# Patient Record
Sex: Male | Born: 1950 | Race: White | Hispanic: No | Marital: Married | Smoking: Never smoker
Health system: Southern US, Community
[De-identification: ages and names within clinical notes are randomized; demographics above are authoritative.]

## PROBLEM LIST (undated history)

## (undated) DIAGNOSIS — N183 Chronic kidney disease, stage 3 unspecified: Secondary | ICD-10-CM

## (undated) DIAGNOSIS — N400 Enlarged prostate without lower urinary tract symptoms: Secondary | ICD-10-CM

## (undated) DIAGNOSIS — R7302 Impaired glucose tolerance (oral): Secondary | ICD-10-CM

## (undated) DIAGNOSIS — G4700 Insomnia, unspecified: Secondary | ICD-10-CM

## (undated) DIAGNOSIS — E7800 Pure hypercholesterolemia, unspecified: Secondary | ICD-10-CM

## (undated) DIAGNOSIS — R42 Dizziness and giddiness: Secondary | ICD-10-CM

## (undated) DIAGNOSIS — K219 Gastro-esophageal reflux disease without esophagitis: Secondary | ICD-10-CM

## (undated) DIAGNOSIS — D509 Iron deficiency anemia, unspecified: Secondary | ICD-10-CM

## (undated) DIAGNOSIS — K2970 Gastritis, unspecified, without bleeding: Secondary | ICD-10-CM

## (undated) DIAGNOSIS — K5790 Diverticulosis of intestine, part unspecified, without perforation or abscess without bleeding: Secondary | ICD-10-CM

## (undated) DIAGNOSIS — M1990 Unspecified osteoarthritis, unspecified site: Secondary | ICD-10-CM

## (undated) DIAGNOSIS — M109 Gout, unspecified: Secondary | ICD-10-CM

## (undated) DIAGNOSIS — I10 Essential (primary) hypertension: Secondary | ICD-10-CM

## (undated) DIAGNOSIS — K649 Unspecified hemorrhoids: Secondary | ICD-10-CM

## (undated) HISTORY — DX: Benign prostatic hyperplasia without lower urinary tract symptoms: N40.0

## (undated) HISTORY — DX: Insomnia, unspecified: G47.00

## (undated) HISTORY — DX: Impaired glucose tolerance (oral): R73.02

## (undated) HISTORY — DX: Pure hypercholesterolemia, unspecified: E78.00

## (undated) HISTORY — DX: Gastro-esophageal reflux disease without esophagitis: K21.9

## (undated) HISTORY — DX: Gout, unspecified: M10.9

## (undated) HISTORY — PX: KNEE ARTHROSCOPY: SUR90

## (undated) HISTORY — DX: Iron deficiency anemia, unspecified: D50.9

## (undated) HISTORY — DX: Gastritis, unspecified, without bleeding: K29.70

## (undated) HISTORY — DX: Unspecified osteoarthritis, unspecified site: M19.90

## (undated) HISTORY — DX: Diverticulosis of intestine, part unspecified, without perforation or abscess without bleeding: K57.90

## (undated) HISTORY — PX: UPPER GASTROINTESTINAL ENDOSCOPY: SHX188

## (undated) HISTORY — DX: Essential (primary) hypertension: I10

## (undated) HISTORY — PX: CATARACT EXTRACTION: SUR2

## (undated) HISTORY — PX: TONSILLECTOMY: SUR1361

## (undated) HISTORY — DX: Chronic kidney disease, stage 3 unspecified: N18.30

## (undated) HISTORY — DX: Dizziness and giddiness: R42

## (undated) HISTORY — DX: Unspecified hemorrhoids: K64.9

---

## 2002-07-09 HISTORY — PX: INGUINAL HERNIA REPAIR: SUR1180

## 2003-03-12 ENCOUNTER — Encounter (HOSPITAL_BASED_OUTPATIENT_CLINIC_OR_DEPARTMENT_OTHER): Payer: Self-pay | Admitting: General Surgery

## 2003-03-16 ENCOUNTER — Ambulatory Visit (HOSPITAL_COMMUNITY): Admission: RE | Admit: 2003-03-16 | Discharge: 2003-03-16 | Payer: Self-pay | Admitting: General Surgery

## 2004-11-10 ENCOUNTER — Ambulatory Visit: Payer: Self-pay | Admitting: Internal Medicine

## 2004-11-24 ENCOUNTER — Ambulatory Visit: Payer: Self-pay | Admitting: Internal Medicine

## 2009-05-16 ENCOUNTER — Ambulatory Visit: Payer: Self-pay | Admitting: Cardiology

## 2009-05-16 ENCOUNTER — Encounter (INDEPENDENT_AMBULATORY_CARE_PROVIDER_SITE_OTHER): Payer: Self-pay | Admitting: Family Medicine

## 2009-05-16 ENCOUNTER — Encounter: Payer: Self-pay | Admitting: Cardiovascular Disease

## 2009-05-16 ENCOUNTER — Ambulatory Visit: Payer: Self-pay

## 2009-05-16 ENCOUNTER — Ambulatory Visit (HOSPITAL_COMMUNITY): Admission: RE | Admit: 2009-05-16 | Discharge: 2009-05-16 | Payer: Self-pay | Admitting: Family Medicine

## 2009-05-16 DIAGNOSIS — R0989 Other specified symptoms and signs involving the circulatory and respiratory systems: Secondary | ICD-10-CM

## 2009-05-23 ENCOUNTER — Telehealth (INDEPENDENT_AMBULATORY_CARE_PROVIDER_SITE_OTHER): Payer: Self-pay | Admitting: *Deleted

## 2010-11-24 NOTE — Op Note (Signed)
NAME:  GILL, DELROSSI NO.:  [id number]   MEDICAL RECORD NO.:  [id number]                   PATIENT TYPE:  OIB   LOCATION:                                       FACILITY:  [facility]   PHYSICIAN:  Leonie Man, M.D.                DATE OF BIRTH:  Sep 03, 1950   DATE OF PROCEDURE:  03/16/2003  DATE OF DISCHARGE:                                 OPERATIVE REPORT   PREOPERATIVE DIAGNOSIS:  Bilateral inguinal hernias.   POSTOPERATIVE DIAGNOSIS:  Bilateral direct inguinal hernias.   OPERATION PERFORMED:  Repair of bilateral direct inguinal hernias with mesh.   SURGEON:  Leonie Man, M.D.   ASSISTANT:  Nurse.   ANESTHESIA:  General.   INDICATIONS FOR PROCEDURE:  The patient is a 60 year old man presenting with  right groin pain, bulge and discomfort and on examination, noted to have  inguinal hernias on both sides.  He comes to the operating room after the  risks and potential benefits of surgery had been fully discussed and all  questions answered and consent obtained.   DESCRIPTION OF PROCEDURE:  Following induction of satisfactory general  anesthesia, the patient was positioned supinely and the groins were prepped  and draped to be included in the sterile operative field.  I began on the  right side and infiltrated the right lower groin crease with 0.5% Marcaine  with epinephrine.  I made a transverse incision approximately 6 cm in  length, deepening this through the skin and subcutaneous tissue, dissecting  down to the external oblique aponeurosis.  The external oblique aponeurosis  was opened up through the external inguinal ring with protection of the  ilioinguinal nerve which was retracted laterally.  The spermatic cord was  elevated and held with a Penrose drain.  Dissection along the anterior  medial aspect of the spermatic cord revealed a very large lipoma which was  dissected free from the surrounding cord contents and vessels up to the  internal ring and this was clamped and then ligated with a 2-0 silk suture.  Subsequently, a very large direct inguinal hernia was dissected free from  the medial and inferior portion of the cord.  This was reduced back into the  retroperitoneum.  We repaired the defect with an onlay patch of  polypropylene mesh which was sewn in at the pubic tubercle with a 2-0  Novofil suture and carrying this as a running suture up along the conjoined  tendon to the internal ring and again from the pubic tubercle along the  shelving edge of Poupart's ligament up to the internal ring.  The mesh was  split allowing the cord to protrude between the leaflets of the mesh.  The  mesh was then trimmed and sutured into the internal oblique muscles thereby  closing off the internal ring.  There was no undue stress or constriction of  the cord noted.  Sponge, instrument and  sharp counts were then verified.  Additional injections of 0.5% Marcaine with epinephrine were used so as to  prolong analgesia.  The external oblique aponeurosis was then closed with a  running 2-0 Vicryl suture protecting the ilioinguinal nerve.  Scarpa's  fascia was closed with running 3-0 Vicryl suture.  Attention was then turned  to the left side where a symmetrically placed incision of approximately 6 cm  was carried down through the skin and subcutaneous tissue and dissection  carried down through the external oblique aponeurosis.  The external oblique  aponeurosis was opened up through the external inguinal ring which retracted  off the ilioinguinal nerve which was retracted laterally and inferiorly.  The spermatic cord was elevated from the inguinal floor and held with a  Penrose drain. Again, a very large lipoma was noted within the anteromedial  aspect of the cord.  This was dissected free from the cord, __________  skeletonizing the cord and carrying the dissection up to the internal ring.  A large lipoma was then clamped and ligated  with 2-0 silk sutures and  amputated and discarded.  Another large direct hernia was noted on the left  side and this was dissected away from the spermatic cord and reduced into  the retroperitoneum and then repaired with an onlay patch of polypropylene  mesh which was sewn in with 2-0 Novofil suture, carrying the suture line up  along the conjoined tendon to the internal ring and again from the pubic  tubercle along the shelving edge of Poupart's ligament up to the internal  ring.  The mesh was split allowing the cord to protrude between the leaflets  of the mesh and the mesh was then trimmed and sutured into the internal  oblique muscle behind the cord assuring that the cord was not unduly  constricted by the suture.  Sponge, instrument and sharp counts were then  verified.  This wound was closed in layers as follows after additional  injections of 0.5% Marcaine were used to prolong analgesia.  The external  oblique aponeurosis was closed with a running suture of 2-0 Vicryl.  Scarpa's fascia closed with a running suture of 3-0 Vicryl.  Both skin  wounds were then closed with running 4-0 Monocryl sutures and then placed  subcuticularly and then reinforced with Steri-Strips.  Sterile dressings  were then applied.  Anesthetic was reversed and the patient removed from the  operating room to the recovery room in stable condition, having tolerated  the procedure well.                                               Leonie Man, M.D.    PB/MEDQ  D:  03/16/2003  T:  03/16/2003  Job:  [id number]

## 2011-08-03 ENCOUNTER — Ambulatory Visit (INDEPENDENT_AMBULATORY_CARE_PROVIDER_SITE_OTHER): Payer: BC Managed Care – PPO | Admitting: *Deleted

## 2011-08-03 DIAGNOSIS — R0989 Other specified symptoms and signs involving the circulatory and respiratory systems: Secondary | ICD-10-CM

## 2011-08-06 ENCOUNTER — Encounter: Payer: Self-pay | Admitting: Family Medicine

## 2011-08-09 NOTE — Procedures (Unsigned)
CAROTID DUPLEX EXAM  INDICATION:  Follow carotid artery disease.  HISTORY: Diabetes:  No. Cardiac:  No. Hypertension:  Yes. Smoking:  No. Previous Surgery: CV History:  Asystomatic. Amaurosis Fugax:  No, Paresthesias No, Hemiparesis No                                      RIGHT             LEFT Brachial systolic pressure:         112               116 Brachial Doppler waveforms:         Biphasic.         Biphasic. Vertebral direction of flow:        Antegrade.        Antegrade. DUPLEX VELOCITIES (cm/sec) CCA peak systolic                   123               122 ECA peak systolic                   106               90 ICA peak systolic                   104               96 ICA end diastolic                   29                36 PLAQUE MORPHOLOGY:                  Mixed, irregular. Mixed, irregular. PLAQUE AMOUNT:                      Small to moderate.                  Moderate. PLAQUE LOCATION:                    Bifurcation and ICA.                Bifurcation and ICA.   IMPRESSION:  A 20% to 39% bilateral ICA stenosis, left greater than right.        ___________________________________________ Larina Earthly, M.D.  SS/MEDQ  D:  08/03/2011  T:  08/03/2011  Job:  [id number]

## 2012-09-24 ENCOUNTER — Other Ambulatory Visit (INDEPENDENT_AMBULATORY_CARE_PROVIDER_SITE_OTHER): Payer: BC Managed Care – PPO | Admitting: *Deleted

## 2012-09-24 DIAGNOSIS — I6529 Occlusion and stenosis of unspecified carotid artery: Secondary | ICD-10-CM

## 2013-08-28 ENCOUNTER — Other Ambulatory Visit: Payer: Self-pay | Admitting: Family Medicine

## 2013-08-28 ENCOUNTER — Ambulatory Visit
Admission: RE | Admit: 2013-08-28 | Discharge: 2013-08-28 | Disposition: A | Discharge status: Home / Self Care | Payer: BC Managed Care – PPO | Source: Ambulatory Visit | Attending: Family Medicine | Admitting: Family Medicine

## 2013-08-28 DIAGNOSIS — R05 Cough: Secondary | ICD-10-CM

## 2013-08-28 DIAGNOSIS — R059 Cough, unspecified: Secondary | ICD-10-CM

## 2015-01-13 ENCOUNTER — Encounter: Payer: Self-pay | Admitting: Internal Medicine

## 2017-01-01 DIAGNOSIS — D126 Benign neoplasm of colon, unspecified: Secondary | ICD-10-CM

## 2017-01-01 HISTORY — DX: Benign neoplasm of colon, unspecified: D12.6

## 2017-01-01 HISTORY — PX: COLONOSCOPY W/ POLYPECTOMY: SHX1380

## 2018-06-12 ENCOUNTER — Ambulatory Visit
Admission: RE | Admit: 2018-06-12 | Discharge: 2018-06-12 | Disposition: A | Discharge status: Home / Self Care | Payer: Medicare Other | Source: Ambulatory Visit | Attending: Family Medicine | Admitting: Family Medicine

## 2018-06-12 ENCOUNTER — Other Ambulatory Visit: Payer: Self-pay | Admitting: Family Medicine

## 2018-06-12 DIAGNOSIS — R51 Headache: Principal | ICD-10-CM

## 2018-06-12 DIAGNOSIS — R519 Headache, unspecified: Secondary | ICD-10-CM

## 2018-06-25 ENCOUNTER — Other Ambulatory Visit: Payer: Self-pay | Admitting: Family Medicine

## 2018-06-25 DIAGNOSIS — G4489 Other headache syndrome: Secondary | ICD-10-CM

## 2018-06-27 ENCOUNTER — Ambulatory Visit
Admission: RE | Admit: 2018-06-27 | Discharge: 2018-06-27 | Disposition: A | Discharge status: Home / Self Care | Payer: Medicare Other | Source: Ambulatory Visit | Attending: Family Medicine | Admitting: Family Medicine

## 2018-06-27 DIAGNOSIS — G4489 Other headache syndrome: Secondary | ICD-10-CM

## 2018-06-27 MED ORDER — GADOBENATE DIMEGLUMINE 529 MG/ML IV SOLN
20.0000 mL | Freq: Once | INTRAVENOUS | Status: AC | PRN
  Administered 2018-06-27: 20 mL via INTRAVENOUS

## 2018-07-06 ENCOUNTER — Other Ambulatory Visit: Payer: Medicare Other

## 2018-07-09 HISTORY — PX: UMBILICAL HERNIA REPAIR: SHX2598

## 2019-08-26 ENCOUNTER — Ambulatory Visit: Payer: Medicare Other

## 2020-01-05 ENCOUNTER — Telehealth: Payer: Self-pay

## 2020-01-05 NOTE — Telephone Encounter (Signed)
I left a detailed message for him to call me and we will work him in per Dr Silvano Rusk.

## 2020-01-05 NOTE — Telephone Encounter (Signed)
Dr Sandi Mariscal is requesting this patient be seen ASAP for his iron def anemia[place] he can be seen. He had a colonoscopy with you in 2006 Sir. Please advise , I have put the records on your desk. Thank you for your time.

## 2020-01-06 NOTE — Telephone Encounter (Signed)
We have set him up to come 01/14/2020 at 11:30AM. His PCP has informed him of this information.

## 2020-01-14 ENCOUNTER — Ambulatory Visit (INDEPENDENT_AMBULATORY_CARE_PROVIDER_SITE_OTHER): Payer: Medicare Other | Admitting: Internal Medicine

## 2020-01-14 ENCOUNTER — Encounter: Payer: Self-pay | Admitting: Internal Medicine

## 2020-01-14 VITALS — BP 130/80 | HR 52 | Ht 68.0 in | Wt 196.5 lb

## 2020-01-14 DIAGNOSIS — Z86010 Personal history of colonic polyps: Secondary | ICD-10-CM

## 2020-01-14 DIAGNOSIS — D508 Other iron deficiency anemias: Secondary | ICD-10-CM

## 2020-01-14 DIAGNOSIS — D649 Anemia, unspecified: Secondary | ICD-10-CM | POA: Insufficient documentation

## 2020-01-14 DIAGNOSIS — R1319 Other dysphagia: Secondary | ICD-10-CM

## 2020-01-14 DIAGNOSIS — R1310 Dysphagia, unspecified: Secondary | ICD-10-CM

## 2020-01-14 NOTE — Patient Instructions (Signed)
You have been scheduled for an endoscopy and colonoscopy. Please follow the written instructions given to you at your visit today. Please pick up your prep supplies at the pharmacy within the next 1-3 days. If you use inhalers (even only as needed), please bring them with you on the day of your procedure.   Please purchase over the counter ferrous Sulfate 325mg  and take one daily.  Then hold it 3 days prior to your procedure.   I appreciate the opportunity to care for you. Silvano Rusk, MD, [facility]

## 2020-01-14 NOTE — Progress Notes (Signed)
Nathaniel [place] 69 y.o. 1950/11/23 [id number]  Assessment & Plan:   Encounter Diagnoses  Name Primary?  . Other iron deficiency anemia Yes  . Esophageal dysphagia   . Hx of adenomatous colonic polyps     Evaluate with EGD and possible esophageal dilation and I think it is prudent to repeat a colonoscopy as well.  Consider celiac biopsies though unlikely depending upon what is seen at the EGD will consider those at the time of procedure.  The risks and benefits as well as alternatives of endoscopic procedure(s) have been discussed and reviewed. All questions answered. The patient agrees to proceed.   Start ferrous sulfate 325 mg daily.  I told him though Hemoccults are reasonable I do not think he needs to complete that testing that was offered by Nathaniel [place].  It will not change her work-up at this point.  I appreciate the opportunity to care for this patient. CC: Nathaniel Late, MD    Subjective:   Chief Complaint: Iron deficiency anemia  HPI Nathaniel [place] is a 69 year old married white man referred by Dr. Derinda [place] because of the new iron deficiency anemia.  Recent laboratory testing in [place] June of this year demonstrated a hemoglobin of 12.7 with an MCV of 83 normal white count and platelet counts.  Thyroid testing normal electrolytes chemistries all normal.  Does have very mild chronic kidney disease with a creatinine of 1.3 with a GFR of 56.  Ferritin was 7 iron saturation 7% TIBC 487 and total iron 38.  He denies any bleeding, there is iron in his diet and he is not a regular blood donor.  [place], [place] exam was negative for any polyps.  A repeat colonoscopy 3 years ago in [place] revealed a 3 mm a sending adenoma and a rectal hyperplastic polyp that was diminutive.  He does have intermittent dysphagia about once every 8 weeks to solid foods.  He is on a PPI which generally controls heartburn but occasionally has to  reach for some Tums. No Known Allergies Current Meds  Medication Sig  . allopurinol (ZYLOPRIM) 300 MG tablet Take 150 mg by mouth daily.  Marland Kitchen ALPRAZolam (XANAX) 0.5 MG tablet Take 0.5 mg by mouth daily as needed.  . chlorthalidone (HYGROTON) 25 MG tablet Take 25 mg by mouth every morning.  Marland Kitchen lisinopril (ZESTRIL) 10 MG tablet Take 10 mg by mouth daily.  Marland Kitchen omeprazole (PRILOSEC) 20 MG capsule Take 20 mg by mouth daily.  . potassium chloride (KLOR-CON) 10 MEQ tablet Take 10 mEq by mouth 4 (four) times daily.  . rosuvastatin (CRESTOR) 20 MG tablet Take 20 mg by mouth at bedtime.  . tadalafil (CIALIS) 5 MG tablet Take 5 mg by mouth daily.   Past Medical History:  Diagnosis Date  . BPH (benign prostatic hyperplasia)   . Diverticulosis   . Gastritis   . GERD (gastroesophageal reflux disease)   . Gout   . Hemorrhoids   . Hypercholesterolemia   . Hypertension   . Impaired glucose tolerance   . Insomnia    intermittent  . Iron deficiency anemia   . Osteoarthritis    knees  . Stage 3 chronic kidney disease   . Tubular adenoma of colon 01/01/2017  . Vertigo    since childhood   Past Surgical History:  Procedure Laterality Date  . COLONOSCOPY W/ POLYPECTOMY  01/01/2017  . INGUINAL HERNIA REPAIR Bilateral 2004  . KNEE ARTHROSCOPY  Left   . TONSILLECTOMY    . UMBILICAL HERNIA REPAIR  2020   Social History   Social History Narrative   Married to [place] in [place], is in [place] frequently for work, he is employed in Nutritional therapist.   4 daughters 10 grandchildren   Occasional alcohol never smoker 2 caffeinated beverages a day no other tobacco   family history includes Breast cancer in his mother and sister; Diabetes in his brother; Heart attack in his maternal grandfather and paternal grandfather; Hiatal hernia in his maternal grandfather; Hypertension in his brother; Kidney Stones in his brother, brother, and father; Sleep apnea in his brother and brother.   Review  of Systems  As per HPI, otherwise negative. Objective:   Physical Exam @BP  130/80 (BP Location: Left Arm, Patient Position: Sitting, Cuff Size: Normal)   Pulse (!) 52   Ht 5\' 8"  (1.727 m) Comment: height measured without shoes  Wt 196 lb 8 oz (89.1 kg)   BMI 29.88 kg/m @  General:  Well-developed, well-nourished and in no acute distress Eyes:  anicteric.  Lungs: Clear to auscultation bilaterally. Heart:  S1S2, no rubs, murmurs, gallops. Abdomen:  soft, non-tender, no hepatosplenomegaly, hernia, or mass and BS+.  Rectal: Deferred until colonoscopy Extremities:   no edema, cyanosis or clubbing Neuro:  A&O x 3.  Psych:  appropriate mood and  Affect.   Data Reviewed:  See HPI

## 2020-02-11 ENCOUNTER — Encounter: Payer: Self-pay | Admitting: Internal Medicine

## 2020-02-24 ENCOUNTER — Encounter: Payer: Self-pay | Admitting: Certified Registered Nurse Anesthetist

## 2020-02-25 ENCOUNTER — Telehealth: Payer: Self-pay | Admitting: Gastroenterology

## 2020-02-25 ENCOUNTER — Ambulatory Visit (AMBULATORY_SURGERY_CENTER): Payer: Medicare Other | Admitting: Gastroenterology

## 2020-02-25 ENCOUNTER — Other Ambulatory Visit: Payer: Self-pay | Admitting: *Deleted

## 2020-02-25 ENCOUNTER — Encounter: Payer: Self-pay | Admitting: Gastroenterology

## 2020-02-25 ENCOUNTER — Encounter: Payer: Medicare Other | Admitting: Internal Medicine

## 2020-02-25 ENCOUNTER — Other Ambulatory Visit: Payer: Self-pay

## 2020-02-25 VITALS — BP 123/75 | HR 67 | Temp 97.9°F | Resp 13 | Ht 68.0 in | Wt 196.0 lb

## 2020-02-25 DIAGNOSIS — R1310 Dysphagia, unspecified: Secondary | ICD-10-CM | POA: Diagnosis not present

## 2020-02-25 DIAGNOSIS — K222 Esophageal obstruction: Secondary | ICD-10-CM

## 2020-02-25 DIAGNOSIS — K649 Unspecified hemorrhoids: Secondary | ICD-10-CM | POA: Diagnosis not present

## 2020-02-25 DIAGNOSIS — D122 Benign neoplasm of ascending colon: Secondary | ICD-10-CM | POA: Diagnosis not present

## 2020-02-25 DIAGNOSIS — K449 Diaphragmatic hernia without obstruction or gangrene: Secondary | ICD-10-CM

## 2020-02-25 DIAGNOSIS — K3189 Other diseases of stomach and duodenum: Secondary | ICD-10-CM

## 2020-02-25 DIAGNOSIS — D509 Iron deficiency anemia, unspecified: Secondary | ICD-10-CM

## 2020-02-25 DIAGNOSIS — D120 Benign neoplasm of cecum: Secondary | ICD-10-CM

## 2020-02-25 DIAGNOSIS — K5730 Diverticulosis of large intestine without perforation or abscess without bleeding: Secondary | ICD-10-CM | POA: Diagnosis not present

## 2020-02-25 DIAGNOSIS — K317 Polyp of stomach and duodenum: Secondary | ICD-10-CM

## 2020-02-25 MED ORDER — FERROUS SULFATE 325 (65 FE) MG PO TBEC: 325.0000 mg | DELAYED_RELEASE_TABLET | Freq: Every day | ORAL | 3 refills | Status: DC

## 2020-02-25 MED ORDER — SODIUM CHLORIDE 0.9 % IV SOLN: 500.0000 mL | Freq: Once | INTRAVENOUS | Status: DC

## 2020-02-25 MED ORDER — ONDANSETRON HCL 4 MG PO TABS: 4.0000 mg | ORAL_TABLET | Freq: Three times a day (TID) | ORAL | 0 refills | Status: AC | PRN

## 2020-02-25 NOTE — Op Note (Signed)
[facility] Patient Name: Nathaniel [place] Procedure Date: 02/25/2020 7:30 AM MRN: [id number] Endoscopist: Remo Lipps P. Havery Moros , MD Age: 69 Referring MD:  Date of Birth: 11-Feb-1951 Gender: Male Account #: [id number] Procedure:                Upper GI endoscopy Indications:              Iron deficiency anemia, history of GERD on                            omeprazole 20mg  / day Medicines:                Monitored Anesthesia Care Procedure:                Pre-Anesthesia Assessment:                           - Prior to the procedure, a History and Physical                            was performed, and patient medications and                            allergies were reviewed. The patient's tolerance of                            previous anesthesia was also reviewed. The risks                            and benefits of the procedure and the sedation                            options and risks were discussed with the patient.                            All questions were answered, and informed consent                            was obtained. Prior Anticoagulants: The patient has                            taken no previous anticoagulant or antiplatelet                            agents. ASA Grade Assessment: III - A patient with                            severe systemic disease. After reviewing the risks                            and benefits, the patient was deemed in                            satisfactory condition to undergo the procedure.  After obtaining informed consent, the endoscope was                            passed under direct vision. Throughout the                            procedure, the patient's blood pressure, pulse, and                            oxygen saturations were monitored continuously. The                            Endoscope was introduced through the mouth, and                            advanced to the second part of  duodenum. The upper                            GI endoscopy was accomplished without difficulty.                            The patient tolerated the procedure well. Scope In: Scope Out: Findings:                 Esophagogastric landmarks were identified: the                            Z-line was found at 34 cm, the gastroesophageal                            junction was found at 34 cm and the upper extent of                            the gastric folds was found at 43 cm from the                            incisors.                           A large 9 cm hiatal hernia was present. No obvious                            Cameron lesions noted however erythema noted within                            hernia sac.                           One benign-appearing, intrinsic mild stenosis was                            found 34 cm from the incisors. This stenosis  measured less than one cm (in length). A TTS                            dilator was passed through the scope. Dilation with                            an 18-19-20 mm balloon dilator was performed to 18                            mm, 19 mm and 20 mm with good result                           The examined distal esophagus was extremely                            tortuous due to large hernia, possible this could                            be related to dysphagia in the setting of large                            hiatal hernia.                           The exam of the esophagus was otherwise normal.                           Multiple 3 to 15 mm pedunculated and sessile polyps                            were found in the gastric fundus and in the gastric                            body. Two polyps about 4-19mm in size were                            erythematous and friable and removed with a hot                            snare. One of the largest polyps was removed with                            hot snare and  hemostasis clip placed at the base to                            prevent bleeding. One 12mm polyp within the hernia                            sac appeared to be a benign lymphangiectasia but                            removed to ensure no adenomatous  change.                           The exam of the stomach was otherwise normal.                           Biopsies were taken with a cold forceps in the                            gastric body, at the incisura and in the gastric                            antrum for Helicobacter pylori testing given iron                            deficiency anemia.                           There was a small lipoma in the second portion of                            the duodenum.                           Benign ectopic gastric mucosa was noted in the                            duodenal bulb. The exam of the duodenum was                            otherwise normal. Complications:            No immediate complications. Estimated blood loss:                            Minimal. Estimated Blood Loss:     Estimated blood loss was minimal. Impression:               - Esophagogastric landmarks identified.                           - Large 9 cm hiatal hernia.                           - Benign-appearing esophageal stenosis. Dilated to                            107mm.                           - Tortuous esophagus.                           - Normal esophagus otherwise                           - Multiple gastric polyps. 4 resected and retrieved  as above. One prophylactic clip was placed.                           - Normal stomach otherwise - biopsies taken to rule                            out H pylori                           - Duodenal lipoma.                           - Normal duodenum otherwise                           A few gastric polyps inflamed / erythematous in                            appearance, removed in case related to  anemia.                            Largest gastric polyp removed, several large polyps                            remain, these are all benign appearing and suspect                            likely fundic gland polyps in the setting of PPI                            use. No obvious cameron lesions noted however some                            erythema within the hernia sac, large hiatal hernia                            could be related to iron deficiency in this light. Recommendation:           - Patient has a contact number available for                            emergencies. The signs and symptoms of potential                            delayed complications were discussed with the                            patient. Return to normal activities tomorrow.                            Written discharge instructions were provided to the                            patient.                           -  Resume previous diet.                           - Continue present medications (omeprazole)                           - Start ferrous sulfate 325mg  daily                           - Await pathology results.                           - No ibuprofen, naproxen, or other non-steroidal                            anti-inflammatory drugs for 2 weeks after polyp                            removal.                           - Repeat CBC in 4-6 weeks Remo Lipps P. Meliza Kage, MD 02/25/2020 9:05:41 AM This report has been signed electronically.

## 2020-02-25 NOTE — Progress Notes (Signed)
0745 Patient experiencing nausea and vomiting.  MD updated and Zofran 4 mg IV given. Robinul 0.1 mg IV given due large amount of secretions upon assessment.  MD made aware, vss

## 2020-02-25 NOTE — Telephone Encounter (Signed)
Pt's wife states the pt is still experiencing nausea and vomiting. Pt had a colonscopy and EGD today. Would like to know if medication could be prescribed

## 2020-02-25 NOTE — Patient Instructions (Signed)
Impression/Recommendations:  Hiatal hernia handout given to patient. Polyp handout given to patient. Diverticulosis handout given to patient. Hemorrhoid handout given to patient.   Resume previous diet. Continue present medications. (Omeprazole) Start ferrous sulfate 325 mg. Daily.  Await pathology results.  No ibuprofen, naproxen, or other NSAID drugs for 2 weeks.  Tylenol only .  Repeat CBC in 4-6- weeks.  YOU HAD AN ENDOSCOPIC PROCEDURE TODAY AT [facility]:   Refer to the procedure report that was given to you for any specific questions about what was found during the examination.  If the procedure report does not answer your questions, please call your gastroenterologist to clarify.  If you requested that your care partner not be given the details of your procedure findings, then the procedure report has been included in a sealed envelope for you to review at your convenience later.  YOU SHOULD EXPECT: Some feelings of bloating in the abdomen. Passage of more gas than usual.  Walking can help get rid of the air that was put into your GI tract during the procedure and reduce the bloating. If you had a lower endoscopy (such as a colonoscopy or flexible sigmoidoscopy) you may notice spotting of blood in your stool or on the toilet paper. If you underwent a bowel prep for your procedure, you may not have a normal bowel movement for a few days.  Please Note:  You might notice some irritation and congestion in your nose or some drainage.  This is from the oxygen used during your procedure.  There is no need for concern and it should clear up in a day or so.  SYMPTOMS TO REPORT IMMEDIATELY:   Following lower endoscopy (colonoscopy or flexible sigmoidoscopy):  Excessive amounts of blood in the stool  Significant tenderness or worsening of abdominal pains  Swelling of the abdomen that is new, acute  Fever of 100F or higher   Following upper endoscopy (EGD)  Vomiting  of blood or coffee ground material  New chest pain or pain under the shoulder blades  Painful or persistently difficult swallowing  New shortness of breath  Fever of 100F or higher  Black, tarry-looking stools  For urgent or emergent issues, a gastroenterologist can be reached at any hour by calling [phone]. Do not use MyChart messaging for urgent concerns.    DIET:  We do recommend a small meal at first, but then you may proceed to your regular diet.  Drink plenty of fluids but you should avoid alcoholic beverages for 24 hours.  ACTIVITY:  You should plan to take it easy for the rest of today and you should NOT DRIVE or use heavy machinery until tomorrow (because of the sedation medicines used during the test).    FOLLOW UP: Our staff will call the number listed on your records 48-72 hours following your procedure to check on you and address any questions or concerns that you may have regarding the information given to you following your procedure. If we do not reach you, we will leave a message.  We will attempt to reach you two times.  During this call, we will ask if you have developed any symptoms of COVID 19. If you develop any symptoms (ie: fever, flu-like symptoms, shortness of breath, cough etc.) before then, please call [phone].  If you test positive for Covid 19 in the 2 weeks post procedure, please call and report this information to [place].    If any biopsies were taken you will  be contacted by phone or by letter within the next 1-3 weeks.  Please call us at [phone] if you have not heard about the biopsies in 3 weeks.    SIGNATURES/CONFIDENTIALITY: You and/or your care partner have signed paperwork which will be entered into your electronic medical record.  These signatures attest to the fact that that the information above on your After Visit Summary has been reviewed and is understood.  Full responsibility of the confidentiality of this discharge information lies  with you and/or your care-partner.

## 2020-02-25 NOTE — Op Note (Signed)
[place] Patient Name: Nathaniel Grimes Procedure Date: 02/25/2020 7:30 AM MRN: [id number] Endoscopist: Remo Lipps P. Havery Moros , MD Age: 69 Referring MD:  Date of Birth: 1951/01/21 Gender: Male Account #: [id number] Procedure:                Colonoscopy Indications:              Iron deficiency anemia Medicines:                Monitored Anesthesia Care Procedure:                Pre-Anesthesia Assessment:                           - Prior to the procedure, a History and Physical                            was performed, and patient medications and                            allergies were reviewed. The patient's tolerance of                            previous anesthesia was also reviewed. The risks                            and benefits of the procedure and the sedation                            options and risks were discussed with the patient.                            All questions were answered, and informed consent                            was obtained. Prior Anticoagulants: The patient has                            taken no previous anticoagulant or antiplatelet                            agents. ASA Grade Assessment: III - A patient with                            severe systemic disease. After reviewing the risks                            and benefits, the patient was deemed in                            satisfactory condition to undergo the procedure.                           After obtaining informed consent, the colonoscope  was passed under direct vision. Throughout the                            procedure, the patient's blood pressure, pulse, and                            oxygen saturations were monitored continuously. The                            Colonoscope was introduced through the anus and                            advanced to the the terminal ileum, with                            identification of the appendiceal  orifice and IC                            valve. The colonoscopy was performed without                            difficulty. The patient tolerated the procedure                            well. The quality of the bowel preparation was                            adequate. The terminal ileum, ileocecal valve,                            appendiceal orifice, and rectum were photographed. Scope In: 8:29:24 AM Scope Out: 8:49:20 AM Scope Withdrawal Time: 0 hours 16 minutes 29 seconds  Total Procedure Duration: 0 hours 19 minutes 56 seconds  Findings:                 The perianal and digital rectal examinations were                            normal.                           The terminal ileum appeared normal.                           A 5 mm polyp was found in the cecum. The polyp was                            flat. The polyp was removed with a cold snare.                            Resection and retrieval were complete.                           A 4 mm polyp was found in the ascending colon. The  polyp was sessile. The polyp was removed with a                            cold snare. Resection and retrieval were complete.                           A few small-mouthed diverticula were found in the                            sigmoid colon.                           Internal hemorrhoids were found during retroflexion.                           The exam was otherwise without abnormality. Complications:            No immediate complications. Estimated blood loss:                            Minimal. Estimated Blood Loss:     Estimated blood loss was minimal. Impression:               - The examined portion of the ileum was normal.                           - One 5 mm polyp in the cecum, removed with a cold                            snare. Resected and retrieved.                           - One 4 mm polyp in the ascending colon, removed                            with a  cold snare. Resected and retrieved.                           - Diverticulosis in the sigmoid colon.                           - Internal hemorrhoids.                           - The examination was otherwise normal.                           No cause for iron deficiency anemia on colonoscopy. Recommendation:           - Patient has a contact number available for                            emergencies. The signs and symptoms of potential                            delayed  complications were discussed with the                            patient. Return to normal activities tomorrow.                            Written discharge instructions were provided to the                            patient.                           - Resume previous diet.                           - Continue present medications.                           - Await pathology results. Remo Lipps P. Jaree Trinka, MD 02/25/2020 8:55:00 AM This report has been signed electronically.

## 2020-02-25 NOTE — Progress Notes (Signed)
Called to room to assist during endoscopic procedure.  Patient ID and intended procedure confirmed with present staff. Received instructions for my participation in the procedure from the performing physician.  

## 2020-02-25 NOTE — Telephone Encounter (Signed)
Pt calling still having nausea and vomiting after his procedure this am. Dr. Havery Moros gave a rx for Zofran 4mg  PO every 6-8 hours PRN #10. Called into patient's pharmacy.

## 2020-02-25 NOTE — Progress Notes (Signed)
No dilation diet handout indicated, per Dr. Havery Moros.

## 2020-02-29 ENCOUNTER — Telehealth: Payer: Self-pay

## 2020-02-29 NOTE — Telephone Encounter (Signed)
Covid-19 screening questions   Do you now or have you had a fever in the last 14 days? No.  Do you have any respiratory symptoms of shortness of breath or cough now or in the last 14 days? No.  Do you have any family members or close contacts with diagnosed or suspected Covid-19 in the past 14 days? No. Have you been tested for Covid-19 and found to be positive? No.       Follow up Call-  Call back number 02/25/2020  Post procedure Call Back phone  # [phone]  Permission to leave phone message Yes  Some recent data might be hidden     Patient questions:  Do you have a fever, pain , or abdominal swelling? No. Pain Score  0 *  Have you tolerated food without any problems? Yes.    Have you been able to return to your normal activities? Yes.    Do you have any questions about your discharge instructions: Diet   No. Medications  No. Follow up visit  No.  Do you have questions or concerns about your Care? No.  Actions: * If pain score is 4 or above: No action needed, pain <4.

## 2020-03-07 ENCOUNTER — Other Ambulatory Visit: Payer: Self-pay | Admitting: Internal Medicine

## 2020-03-07 DIAGNOSIS — D509 Iron deficiency anemia, unspecified: Secondary | ICD-10-CM

## 2020-04-06 ENCOUNTER — Other Ambulatory Visit (INDEPENDENT_AMBULATORY_CARE_PROVIDER_SITE_OTHER): Payer: Medicare Other

## 2020-04-06 DIAGNOSIS — D509 Iron deficiency anemia, unspecified: Secondary | ICD-10-CM | POA: Diagnosis not present

## 2020-04-06 LAB — CBC WITH DIFFERENTIAL/PLATELET
Basophils Absolute: 0 10*3/uL (ref 0.0–0.1)
Basophils Relative: 0.9 % (ref 0.0–3.0)
Eosinophils Absolute: 0.1 10*3/uL (ref 0.0–0.7)
Eosinophils Relative: 2.1 % (ref 0.0–5.0)
HCT: 47.2 % (ref 39.0–52.0)
Hemoglobin: 16 g/dL (ref 13.0–17.0)
Lymphocytes Relative: 18.9 % (ref 12.0–46.0)
Lymphs Abs: 1.1 10*3/uL (ref 0.7–4.0)
MCHC: 33.8 g/dL (ref 30.0–36.0)
MCV: 88.4 fl (ref 78.0–100.0)
Monocytes Absolute: 1.1 10*3/uL — ABNORMAL HIGH (ref 0.1–1.0)
Monocytes Relative: 19.2 % — ABNORMAL HIGH (ref 3.0–12.0)
Neutro Abs: 3.4 10*3/uL (ref 1.4–7.7)
Neutrophils Relative %: 58.9 % (ref 43.0–77.0)
Platelets: 136 10*3/uL — ABNORMAL LOW (ref 150.0–400.0)
RBC: 5.35 Mil/uL (ref 4.22–5.81)
RDW: 18.1 % — ABNORMAL HIGH (ref 11.5–15.5)
WBC: 5.7 10*3/uL (ref 4.0–10.5)

## 2020-09-21 IMAGING — CT CT MAXILLOFACIAL W/O CM
1 series · 8 of 30 positions shown, 10 images · non-contrast
Comparison: None.

CLINICAL DATA: Sinus pressure and headache. Pain on left-sided
head. Currently on antibiotics. Symptoms for 2 weeks.

EXAM:
CT MAXILLOFACIAL WITHOUT CONTRAST
TECHNIQUE: Multidetector CT imaging of the maxillofacial structures was
performed. Multiplanar CT image reconstructions were also generated.

[Series 5: cor bone · coronal · 0.23mm/px · 8 of 99 slices shown, 10 images]
[im 7/99  brain]
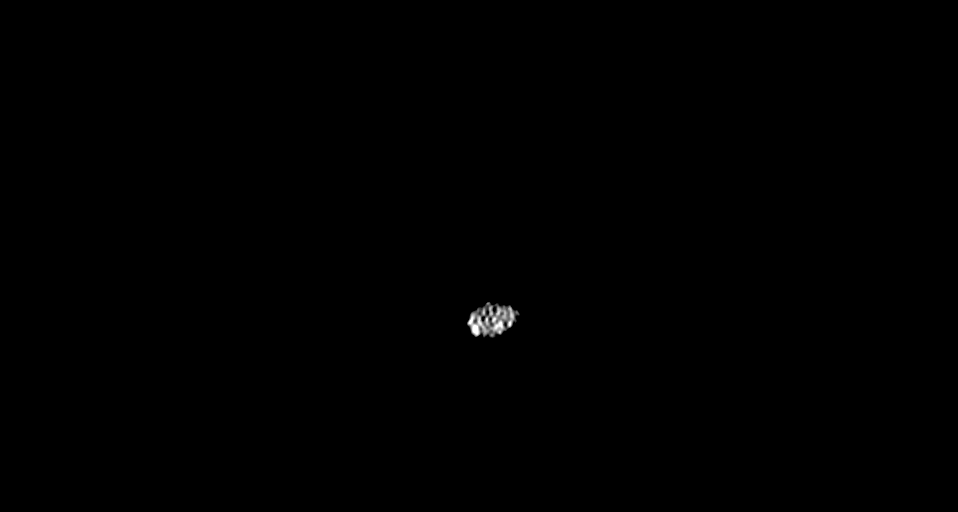
[im 7/99  bone]
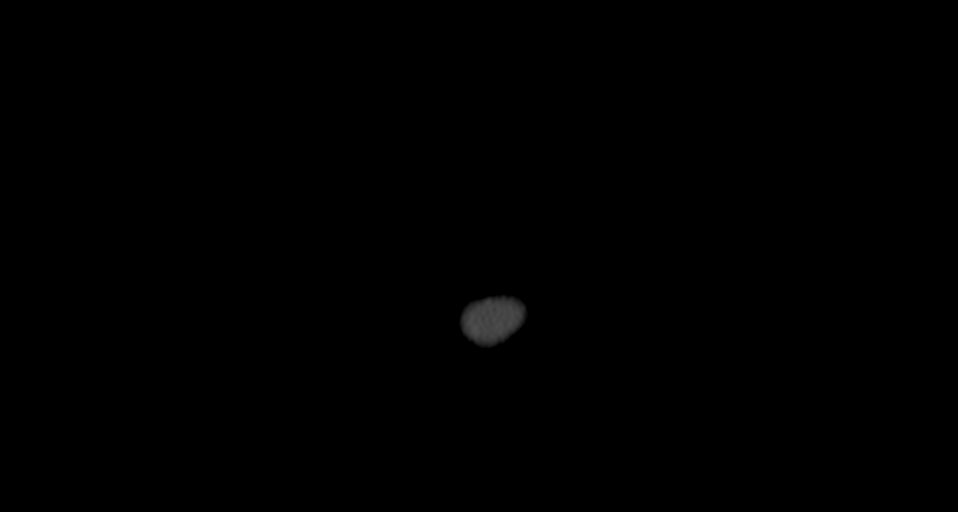
[im 21/99  bone]
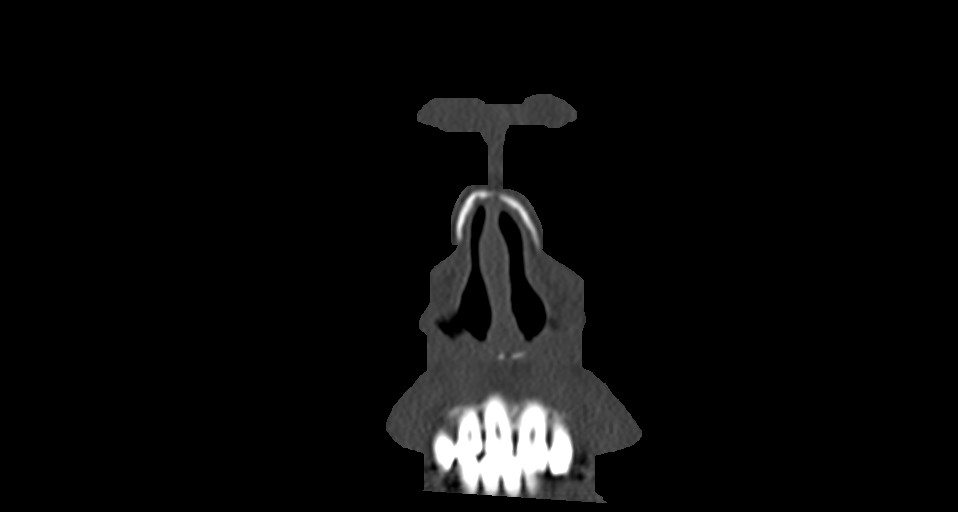
[im 31/99  bone]
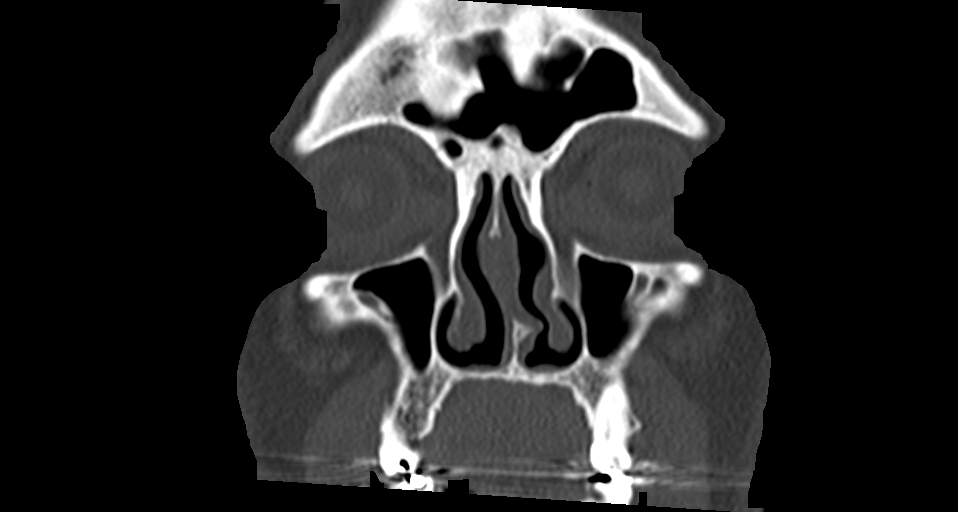
[im 44/99  bone]
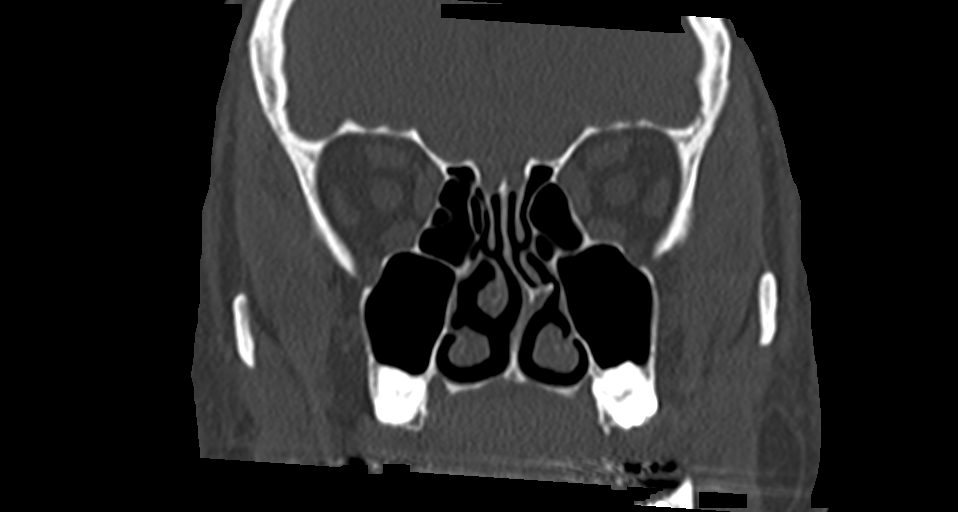
[im 55/99  brain]
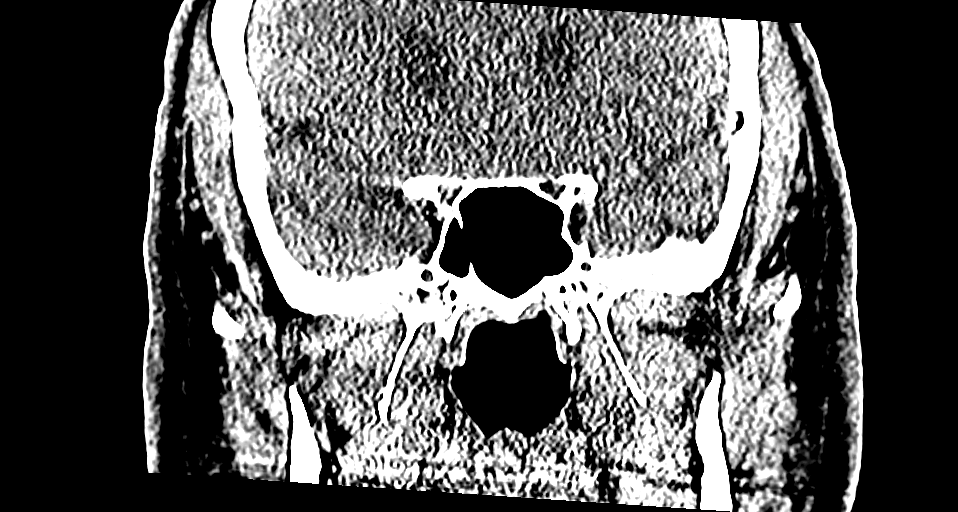
[im 55/99  bone]
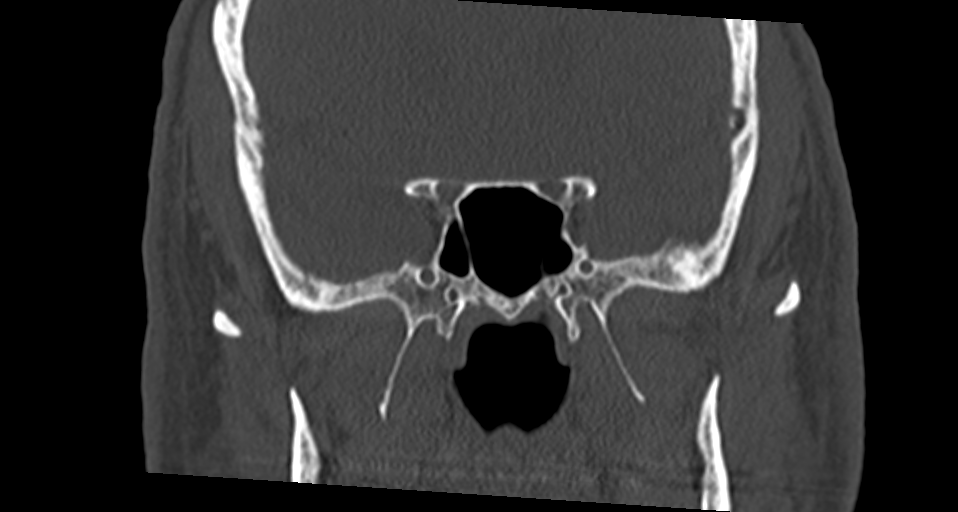
[im 68/99  bone]
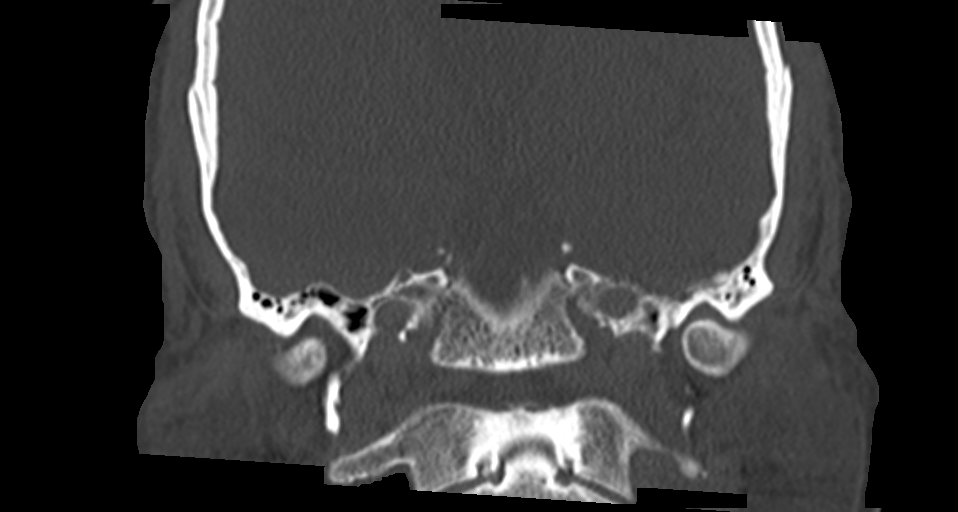
[im 78/99  bone]
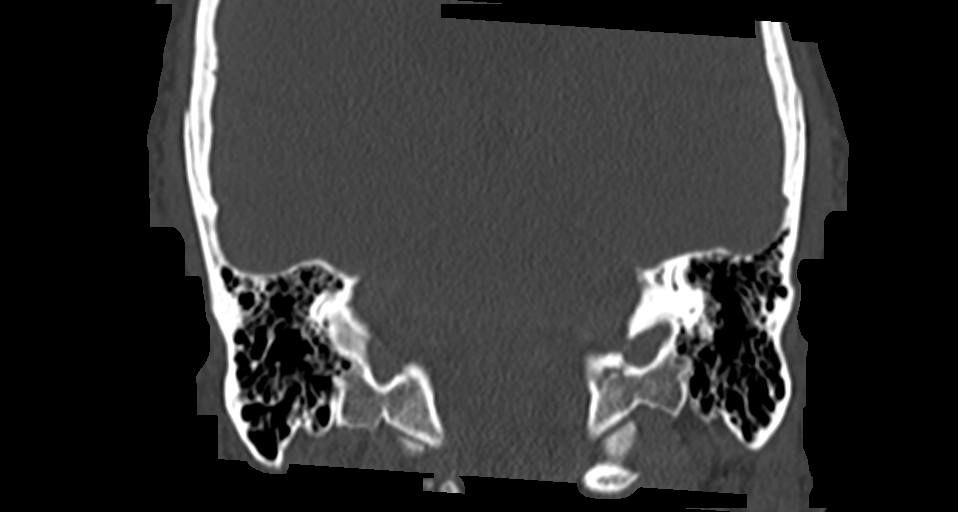
[im 92/99  bone]
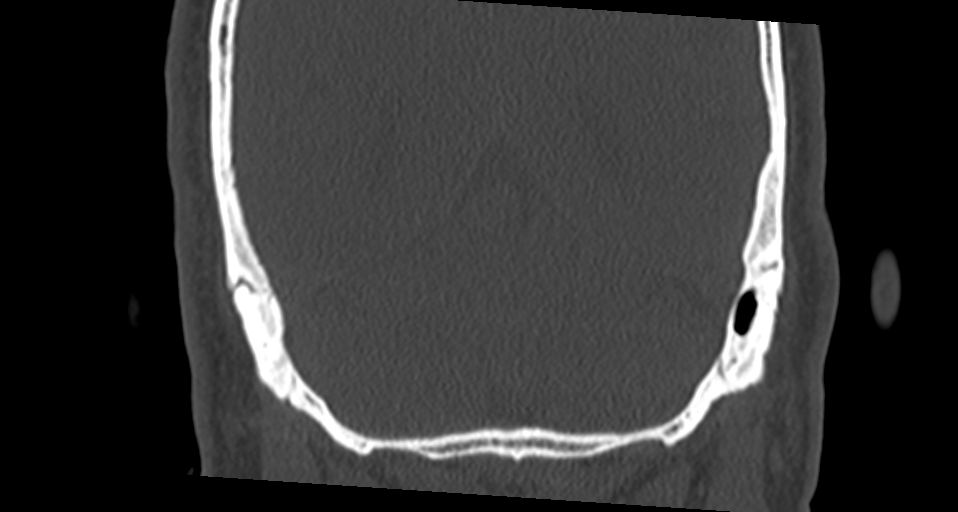

[8 of 30 positions shown; findings below may reference images not displayed]

FINDINGS: Osseous: No fracture or mandibular dislocation. No destructive
process.

Orbits: Negative. No traumatic or inflammatory finding.

Sinuses: Clear.

Soft tissues: Negative.

Limited intracranial: No significant or unexpected finding.
IMPRESSION: No sinus disease.  No acute abnormalities.

## 2020-10-28 ENCOUNTER — Other Ambulatory Visit: Payer: Self-pay | Admitting: Family Medicine

## 2020-10-28 ENCOUNTER — Ambulatory Visit
Admission: RE | Admit: 2020-10-28 | Discharge: 2020-10-28 | Disposition: A | Discharge status: Home / Self Care | Payer: Medicare Other | Source: Ambulatory Visit | Attending: Family Medicine | Admitting: Family Medicine

## 2020-10-28 DIAGNOSIS — R059 Cough, unspecified: Secondary | ICD-10-CM

## 2020-10-28 DIAGNOSIS — R509 Fever, unspecified: Secondary | ICD-10-CM

## 2020-10-28 DIAGNOSIS — R0989 Other specified symptoms and signs involving the circulatory and respiratory systems: Secondary | ICD-10-CM

## 2020-10-29 LAB — SARS CORONAVIRUS 2 (TAT 6-24 HRS): SARS Coronavirus 2: NEGATIVE

## 2021-10-18 ENCOUNTER — Ambulatory Visit
Admission: RE | Admit: 2021-10-18 | Discharge: 2021-10-18 | Disposition: A | Discharge status: Home / Self Care | Payer: Medicare Other | Source: Ambulatory Visit | Attending: Family Medicine | Admitting: Family Medicine

## 2021-10-18 ENCOUNTER — Other Ambulatory Visit: Payer: Self-pay | Admitting: Family Medicine

## 2021-10-18 DIAGNOSIS — R0602 Shortness of breath: Secondary | ICD-10-CM

## 2021-10-20 ENCOUNTER — Telehealth: Payer: Self-pay | Admitting: Internal Medicine

## 2021-10-20 NOTE — Telephone Encounter (Signed)
Spoke to patient ?He will stay on ferrous sulfate ? ?He needs appt me in June (going to [place] 11/2021) ? ?Please contact him next week and set up June appt me  ? ?Gastric polyps ?anemia ?

## 2021-10-20 NOTE — Telephone Encounter (Signed)
Iron def again ?I suspect bleeding from gastric opolyps ?Tx w/ ferrous sulfate - consider parenteral iron ?Consider EGD and polypectomy ?

## 2021-10-23 NOTE — Telephone Encounter (Signed)
Pt was made aware of Dr. Carlean Purl recommendations: Pt was scheduled for an office visit on 12/13/2021 at 11:10 AM: Pt made aware ?Pt verbalized understanding with all questions answered.  ? ?

## 2021-11-08 ENCOUNTER — Other Ambulatory Visit: Payer: Self-pay

## 2021-11-08 ENCOUNTER — Encounter: Payer: Self-pay | Admitting: Hematology & Oncology

## 2021-11-08 ENCOUNTER — Inpatient Hospital Stay: Payer: Medicare Other | Attending: Hematology & Oncology

## 2021-11-08 ENCOUNTER — Inpatient Hospital Stay (HOSPITAL_BASED_OUTPATIENT_CLINIC_OR_DEPARTMENT_OTHER): Payer: Medicare Other | Admitting: Hematology & Oncology

## 2021-11-08 VITALS — BP 137/63 | HR 62 | Temp 97.7°F | Resp 18 | Ht 67.0 in | Wt 200.1 lb

## 2021-11-08 DIAGNOSIS — K909 Intestinal malabsorption, unspecified: Secondary | ICD-10-CM | POA: Diagnosis not present

## 2021-11-08 DIAGNOSIS — Z833 Family history of diabetes mellitus: Secondary | ICD-10-CM

## 2021-11-08 DIAGNOSIS — Z836 Family history of other diseases of the respiratory system: Secondary | ICD-10-CM | POA: Diagnosis not present

## 2021-11-08 DIAGNOSIS — Z8249 Family history of ischemic heart disease and other diseases of the circulatory system: Secondary | ICD-10-CM

## 2021-11-08 DIAGNOSIS — D508 Other iron deficiency anemias: Secondary | ICD-10-CM | POA: Diagnosis present

## 2021-11-08 DIAGNOSIS — Z8379 Family history of other diseases of the digestive system: Secondary | ICD-10-CM

## 2021-11-08 DIAGNOSIS — Z841 Family history of disorders of kidney and ureter: Secondary | ICD-10-CM

## 2021-11-08 DIAGNOSIS — R5383 Other fatigue: Secondary | ICD-10-CM

## 2021-11-08 DIAGNOSIS — Z79899 Other long term (current) drug therapy: Secondary | ICD-10-CM

## 2021-11-08 DIAGNOSIS — R1319 Other dysphagia: Secondary | ICD-10-CM

## 2021-11-08 DIAGNOSIS — Z86010 Personal history of colonic polyps: Secondary | ICD-10-CM | POA: Diagnosis not present

## 2021-11-08 DIAGNOSIS — Z803 Family history of malignant neoplasm of breast: Secondary | ICD-10-CM | POA: Insufficient documentation

## 2021-11-08 DIAGNOSIS — Z8719 Personal history of other diseases of the digestive system: Secondary | ICD-10-CM

## 2021-11-08 DIAGNOSIS — D509 Iron deficiency anemia, unspecified: Secondary | ICD-10-CM

## 2021-11-08 DIAGNOSIS — D500 Iron deficiency anemia secondary to blood loss (chronic): Secondary | ICD-10-CM

## 2021-11-08 HISTORY — DX: Intestinal malabsorption, unspecified: K90.9

## 2021-11-08 HISTORY — DX: Iron deficiency anemia, unspecified: D50.9

## 2021-11-08 LAB — CBC WITH DIFFERENTIAL (CANCER CENTER ONLY)
Abs Immature Granulocytes: 0.01 10*3/uL (ref 0.00–0.07)
Basophils Absolute: 0 10*3/uL (ref 0.0–0.1)
Basophils Relative: 1 %
Eosinophils Absolute: 0.2 10*3/uL (ref 0.0–0.5)
Eosinophils Relative: 3 %
HCT: 34.8 % — ABNORMAL LOW (ref 39.0–52.0)
Hemoglobin: 10 g/dL — ABNORMAL LOW (ref 13.0–17.0)
Immature Granulocytes: 0 %
Lymphocytes Relative: 27 %
Lymphs Abs: 1.5 10*3/uL (ref 0.7–4.0)
MCH: 21.1 pg — ABNORMAL LOW (ref 26.0–34.0)
MCHC: 28.7 g/dL — ABNORMAL LOW (ref 30.0–36.0)
MCV: 73.4 fL — ABNORMAL LOW (ref 80.0–100.0)
Monocytes Absolute: 0.7 10*3/uL (ref 0.1–1.0)
Monocytes Relative: 12 %
Neutro Abs: 3.1 10*3/uL (ref 1.7–7.7)
Neutrophils Relative %: 57 %
Platelet Count: 277 10*3/uL (ref 150–400)
RBC: 4.74 MIL/uL (ref 4.22–5.81)
RDW: 23.3 % — ABNORMAL HIGH (ref 11.5–15.5)
WBC Count: 5.5 10*3/uL (ref 4.0–10.5)
nRBC: 0 % (ref 0.0–0.2)

## 2021-11-08 LAB — CMP (CANCER CENTER ONLY)
ALT: 14 U/L (ref 0–44)
AST: 16 U/L (ref 15–41)
Albumin: 4.7 g/dL (ref 3.5–5.0)
Alkaline Phosphatase: 46 U/L (ref 38–126)
Anion gap: 8 (ref 5–15)
BUN: 17 mg/dL (ref 8–23)
CO2: 29 mmol/L (ref 22–32)
Calcium: 9.8 mg/dL (ref 8.9–10.3)
Chloride: 104 mmol/L (ref 98–111)
Creatinine: 1.42 mg/dL — ABNORMAL HIGH (ref 0.61–1.24)
GFR, Estimated: 53 mL/min — ABNORMAL LOW (ref >60)
Glucose, Bld: 109 mg/dL — ABNORMAL HIGH (ref 70–99)
Potassium: 3.6 mmol/L (ref 3.5–5.1)
Sodium: 141 mmol/L (ref 135–145)
Total Bilirubin: 0.5 mg/dL (ref 0.3–1.2)
Total Protein: 7 g/dL (ref 6.5–8.1)

## 2021-11-08 LAB — RETICULOCYTES
Immature Retic Fract: 25.6 % — ABNORMAL HIGH (ref 2.3–15.9)
RBC.: 4.68 MIL/uL (ref 4.22–5.81)
Retic Count, Absolute: 89.4 10*3/uL (ref 19.0–186.0)
Retic Ct Pct: 1.9 % (ref 0.4–3.1)

## 2021-11-08 LAB — SAVE SMEAR(SSMR), FOR PROVIDER SLIDE REVIEW

## 2021-11-08 LAB — FERRITIN: Ferritin: 8 ng/mL — ABNORMAL LOW (ref 24–336)

## 2021-11-08 NOTE — Progress Notes (Signed)
Referral MD ? ?Reason for Referral: Iron deficiency anemia-iron malabsorption ? ?Chief Complaint  ?Patient presents with  ? New Patient (Initial Visit)  ?: My iron is low again. ? ?HPI: Mr. Nathaniel Grimes is a really nice 71 year old white male.  He comes in with his wife.  They actually live in [facility].  He is still working.  He has a Psychologist, clinical.  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 ? ?He is wife are actually going over to [place] I think this week.  There will be there for a couple weeks.  His grandfather I think was from [place]. ? ?He is followed by Dr. Sandi Mariscal.  He has been found to have iron deficiency.  He has had this in the past. ? ?He has had endoscopies.  He had endoscopy a couple years ago.  Had upper and lower endoscopy.  I think he may have had some GERD if he will not mistaken. ? ?He is on oral iron right now.  He is tolerating oral iron okay.  I told him to take vitamin C with the oral iron. ? ?He has had some hernia surgery.  He has had no problems with smoking.  He has occasional alcohol use. ? ?He has not noted any obvious change in bowel or bladder habits. ? ?He had labs done back in April.  At that time, his white cell count was 5.6.  Hemoglobin 9.3.  Platelet count 259,000.  MCV of 73. ? ?His iron studies showed a ferritin of 2 with an iron saturation of 3%. ? ?He was, referred to the [facility] for an evaluation. ? ?Again he is on oral iron.  He is tolerating this fairly well right now. ? ?Overall, I would say his performance status is probably ECOG 0. ? ? ? ?Past Medical History:  ?Diagnosis Date  ? BPH (benign prostatic hyperplasia)   ? Diverticulosis   ? Gastritis   ? GERD (gastroesophageal reflux disease)   ? Gout   ? Hemorrhoids   ? Hypercholesterolemia   ? Hypertension   ? Impaired glucose tolerance   ? Insomnia   ? intermittent  ? Iron deficiency anemia   ? Osteoarthritis   ? knees  ? Stage 3 chronic kidney disease ([place])   ? Tubular adenoma  of colon 01/01/2017  ? 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 ? ?Past Surgical History:  ?Procedure Laterality Date  ? COLONOSCOPY W/ POLYPECTOMY  01/01/2017  ? INGUINAL HERNIA REPAIR Bilateral 2004  ? KNEE ARTHROSCOPY Left   ? TONSILLECTOMY    ? UMBILICAL HERNIA REPAIR  2020  ?: ? ? ?Current Outpatient Medications:  ?  Acetaminophen (TYLENOL 8 HOUR PO), Take 1 tablet by mouth every 6 (six) hours as needed., Disp: , Rfl:  ?  allopurinol (ZYLOPRIM) 300 MG tablet, Take 150 mg by mouth daily., Disp: , Rfl:  ?  chlorthalidone (HYGROTON) 25 MG tablet, Take 25 mg by mouth every morning., Disp: , Rfl:  ?  ferrous sulfate 325 (65 FE) MG EC tablet, Take 1 tablet (325 mg total) by mouth daily with breakfast., Disp: 90 tablet, Rfl: 3 ?  Glucosamine-Chondroitin 500-400 MG CAPS, Take 1 tablet by mouth daily., Disp: , Rfl:  ?  lisinopril (ZESTRIL) 10 MG tablet, Take 10 mg by mouth daily., Disp: , Rfl:  ?  omeprazole (PRILOSEC) 20 MG capsule, Take 20 mg by mouth 2 (two) times daily before a meal., Disp: , Rfl:  ?  ondansetron (ZOFRAN) 4 MG tablet, Take 1 tablet (4 mg total) by mouth every 8 (eight) hours as needed for nausea or vomiting (every 6 hours as needed). May take very 6-8 hours as needed, Disp: 10 tablet, Rfl: 0 ?  potassium chloride (KLOR-CON) 10 MEQ tablet, Take 10 mEq by mouth 4 (four) times daily., Disp: , Rfl:  ?  rosuvastatin (CRESTOR) 20 MG tablet, Take 20 mg by mouth at bedtime., Disp: , Rfl:  ?  tadalafil (CIALIS) 5 MG tablet, Take 5 mg by mouth daily., Disp: , Rfl:  ?  ALPRAZolam (XANAX) 0.5 MG tablet, Take 0.5 mg by mouth daily as needed. (Patient not taking: Reported on 11/08/2021), Disp: , Rfl: : ? ?: ? ?No Known Allergies: ? ? ?Family History  ?Problem Relation Age of Onset  ? Breast cancer Mother   ? Kidney Stones Father   ?     chronic  ? Breast cancer Sister   ? Diabetes Brother   ? Hypertension Brother   ? Sleep apnea Brother   ? Kidney Stones Brother   ? Sleep apnea Brother   ? Hiatal hernia Maternal  Grandfather   ? Heart attack Maternal Grandfather   ? Heart attack Paternal Grandfather   ? Kidney Stones Brother   ?: ? ? ?Social History  ? ?Socioeconomic History  ? Marital status: Married  ?  Spouse name: Not on file  ? Number of children: 4  ? Years of education: Not on file  ? Highest education level: Not on file  ?Occupational History  ? Occupation: Press photographer  ?Tobacco Use  ? Smoking status: Never  ? Smokeless tobacco: Never  ?Vaping Use  ? Vaping Use: Never used  ?Substance and Sexual Activity  ? Alcohol use: Yes  ?  Comment: < 1 per day  ? Drug use: Never  ? Sexual activity: Not on file  ?Other Topics Concern  ? Not on file  ?Social History Narrative  ? Married to [place]  ? Lives in [facility], is in [place] frequently for work, he is employed in Nutritional therapist.  ? 4 daughters 10 grandchildren  ? Occasional alcohol never smoker 2 caffeinated beverages a day no other tobacco  ? ?Social Determinants of Health  ? ?Financial Resource Strain: Not on file  ?Food Insecurity: Not on file  ?Transportation Needs: Not on file  ?Physical Activity: Not on file  ?Stress: Not on file  ?Social Connections: Not on file  ?Intimate Partner Violence: Not on file  ?: ? ?Review of Systems  ?Constitutional:  Positive for malaise/fatigue.  ?HENT: Negative.    ?Eyes: Negative.   ?Respiratory: Negative.    ?Cardiovascular: Negative.   ?Gastrointestinal: Negative.   ?Genitourinary: Negative.   ?Musculoskeletal: Negative.   ?Skin: Negative.   ?Neurological: Negative.   ?Endo/Heme/Allergies: Negative.   ?Psychiatric/Behavioral: Negative.    ? ? ?Exam: ?'@IPVITALS'$ @ ?Physical Exam ?Vitals reviewed.  ?HENT:  ?   Head: Normocephalic and atraumatic.  ?Eyes:  ?   Pupils: Pupils are equal, round, and reactive to light.  ?Cardiovascular:  ?   Rate and Rhythm: Normal rate and regular rhythm.  ?   Heart sounds: Normal heart sounds.  ?Pulmonary:  ?   Effort: Pulmonary effort is normal.  ?   Breath sounds: Normal breath sounds.  ?Abdominal:  ?    General: Bowel sounds are normal.  ?   Palpations: Abdomen is soft.  ?Musculoskeletal:     ?   General: No tenderness or deformity. Normal  range of motion.  ?   Cervical back: Normal range of motion.  ?Lymphadenopathy:  ?   Cervical: No cervical adenopathy.  ?Skin: ?   General: Skin is warm and dry.  ?   Findings: No erythema or rash.  ?Neurological:  ?   Mental Status: He is alert and oriented to person, place, and time.  ?Psychiatric:     ?   Behavior: Behavior normal.     ?   Thought Content: Thought content normal.     ?   Judgment: Judgment normal.  ? ? ? ? ?Recent Labs  ?  11/08/21 ?1343  ?WBC 5.5  ?HGB 10.0*  ?HCT 34.8*  ?PLT 277  ? ? ?Recent Labs  ?  11/08/21 ?1343  ?NA 141  ?K 3.6  ?CL 104  ?CO2 29  ?GLUCOSE 109*  ?BUN 17  ?CREATININE 1.42*  ?CALCIUM 9.8  ? ? ?Blood smear review: Hypochromic and microcytic red blood cells.  There are some anisocytosis and poikilocytosis.  There are no nucleated red blood cells.  I see no teardrop cells.  He has no rouleaux formation.  There is no polychromasia.  White cells appear normal in morphology and maturation.  He has no immature myeloid or lymphoid forms.  There is no hypersegmented polys.  Platelets are adequate number and size.  Platelets are well granulated. ? ?Pathology: None ? ? ? ?Assessment and Plan: Nathaniel Grimes is a very nice 71 year old white male.  He has iron deficiency anemia.  He may have some GERD.  He is on Prilosec.  This might be causing iron not to be absorbed. ? ?I do not think he needs to have any other scope.  He was just scoped couple years ago. ? ?We concern do IV iron.  I would like to see how he does with oral iron.  Since he is tolerating the oral iron okay, I think we need to see if this is going to work.  He has not been on oral iron for a couple weeks. ? ?Again, I told him to take vitamin C along with the oral iron. ? ?I would like to get him back to see [place] in about 6 weeks.  By then, he would have been on the oral iron long enough for  [place] to see a response.  I would think that the MCV should go up initially and then followed by his hemoglobin/hematocrit. ? ?I do not see need for any bone marrow test.  He does not need any radiologic studies

## 2021-11-09 LAB — IRON AND IRON BINDING CAPACITY (CC-WL,HP ONLY)
Iron: 15 ug/dL — ABNORMAL LOW (ref 45–182)
Saturation Ratios: 3 % — ABNORMAL LOW (ref 17.9–39.5)
TIBC: 606 ug/dL — ABNORMAL HIGH (ref 250–450)
UIBC: 591 ug/dL — ABNORMAL HIGH (ref 117–376)

## 2021-11-10 LAB — ERYTHROPOIETIN: Erythropoietin: 124.5 m[IU]/mL — ABNORMAL HIGH (ref 2.6–18.5)

## 2021-12-13 ENCOUNTER — Encounter: Payer: Self-pay | Admitting: Internal Medicine

## 2021-12-13 ENCOUNTER — Ambulatory Visit: Payer: Medicare Other | Admitting: Internal Medicine

## 2021-12-13 VITALS — BP 124/72 | HR 70 | Ht 67.0 in | Wt 196.0 lb

## 2021-12-13 DIAGNOSIS — D500 Iron deficiency anemia secondary to blood loss (chronic): Secondary | ICD-10-CM

## 2021-12-13 DIAGNOSIS — K317 Polyp of stomach and duodenum: Secondary | ICD-10-CM

## 2021-12-13 DIAGNOSIS — K449 Diaphragmatic hernia without obstruction or gangrene: Secondary | ICD-10-CM

## 2021-12-13 NOTE — Progress Notes (Signed)
Nathaniel Grimes 71 y.o. 1951-01-15 [id number]  Assessment & Plan:   Encounter Diagnoses  Name Primary?   Iron deficiency anemia due to chronic blood loss  Yes   Large hiatal hernia    Gastric polyps     Presumably he is losing blood/iron from gastric polyps or possibly a large hiatal hernia with Nathaniel Grimes erosions though they were not seen before.  He did ask about the possibility of fixing the hernia and I would not recommend that in his case and at his age.  I offered that we could repeat an upper endoscopy and remove more polyps.  He wants to think about that.  He and I both think it is a good idea to see what his next iron studies show as well.  I suspect lifelong iron therapy is appropriate, and he should have a CBC at least 3 times a year always.  My suspicion is this is really related to the large hiatal hernia and not the polyps most likely though I do not know.  The polyps removed were benign fundic gland polyps and those do not need surveillance.  Note he has very rare mild dysphagia and does not feel like he needs a repeat dilation.  I appreciate the opportunity to care for this patient. CC: Nathaniel Late, MD Nathaniel Gauze MD Subjective:   Chief Complaint: Recurrent iron deficiency anemia  HPI 71 year old white man with history of iron deficiency anemia and dysphagia issues, status post evaluation in August 2021, with recurrent iron deficiency anemia earlier this year.  He has not noted signs of bleeding.  He received the parenteral iron treatment through Dr. Marin Olp in the spring and then he had some extended trips to [place].  He reported his dyspnea and fatigue symptoms much better after the parenteral iron.  He actually had labs at Dr. Deboraha Sprang office today to follow things up.  He has been taking oral iron and vitamin C tablets.  EGD demonstrated the following: - Esophagogastric landmarks identified. - Large 9 cm hiatal hernia. - Benign-appearing esophageal  stenosis. Dilated to 64m. - Tortuous esophagus. - Normal esophagus otherwise - Multiple gastric polyps. 4 resected and retrieved as above. One prophylactic clip was placed.  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 - Normal duodenum otherwise  A few gastric polyps inflamed / erythematous in appearance, removed in case related to anemia. Largest gastric polyp removed, several large polyps remain, these are all benign appearing and suspect likely fundic gland polyps in the setting of PPI use. No obvious cameron lesions noted however some erythema within the hernia sac, large hiatal hernia could be related to iron deficiency in this light.  Colonoscopy demonstrated Diverticulosis Internal hemorrhoids 2 diminutive polyps 1 SSP 1 adenoma No Known Allergies Current Meds  Medication Sig   Acetaminophen (TYLENOL 8 HOUR PO) Take 1 tablet by mouth every 6 (six) hours as needed.   allopurinol (ZYLOPRIM) 300 MG tablet Take 150 mg by mouth daily.   ALPRAZolam (XANAX) 0.5 MG tablet Take 0.5 mg by mouth daily as needed.   chlorthalidone (HYGROTON) 25 MG tablet Take 25 mg by mouth every morning.   ferrous sulfate 325 (65 FE) MG EC tablet Take 1 tablet (325 mg total) by mouth daily with breakfast.   Glucosamine-Chondroitin 500-400 MG CAPS Take 1 tablet by mouth daily.   lisinopril (ZESTRIL) 10 MG tablet Take 10 mg by mouth daily.  omeprazole (PRILOSEC) 20 MG capsule Take 20 mg by mouth 2 (two) times daily before a meal.   ondansetron (ZOFRAN) 4 MG tablet Take 1 tablet (4 mg total) by mouth every 8 (eight) hours as needed for nausea or vomiting (every 6 hours as needed). May take very 6-8 hours as needed   potassium chloride (KLOR-CON) 10 MEQ tablet Take 10 mEq by mouth 4 (four) times daily.   rosuvastatin (CRESTOR) 20 MG tablet Take 20 mg by mouth at bedtime.   tadalafil (CIALIS) 5 MG tablet Take 5 mg by  mouth daily.   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   4 daughters 10 grandchildren   Occasional alcohol never smoker 2 caffeinated beverages a day no other tobacco   family history includes Breast cancer in his mother and sister; Diabetes in his brother; Heart attack in his maternal grandfather and paternal grandfather; Hiatal hernia in his maternal grandfather; Hypertension in his brother; Kidney Stones in his brother, brother, and father; Sleep apnea in his brother and brother.   Review of Systems As above  Objective:   Physical Exam BP 124/72   Pulse 70   Ht '5\' 7"'$  (1.702 m)   Wt 196 lb (88.9 kg)   BMI 30.70 kg/m

## 2021-12-13 NOTE — Patient Instructions (Signed)
If you are age 71 or older, your body mass index should be between 23-30. Your Body mass index is 30.7 kg/m. If this is out of the aforementioned range listed, please consider follow up with your Primary Care Provider.  If you are age 56 or younger, your body mass index should be between 19-25. Your Body mass index is 30.7 kg/m. If this is out of the aformentioned range listed, please consider follow up with your Primary Care Provider.   ________________________________________________________  The [facility] providers would like to encourage you to use [facility] to communicate with providers for non-urgent requests or questions.  Due to long hold times on the telephone, sending your provider a message by [facility] may be a faster and more efficient way to get a response.  Please allow 48 business hours for a response.  Please remember that this is for non-urgent requests.  _______________________________________________________  We will await your lab results that you having done at Dr [facility] office.   I appreciate the opportunity to care for you. Silvano Rusk, MD, [facility]

## 2022-01-02 ENCOUNTER — Inpatient Hospital Stay: Payer: Medicare Other

## 2022-01-02 ENCOUNTER — Inpatient Hospital Stay: Payer: Medicare Other | Admitting: Hematology & Oncology

## 2022-01-02 ENCOUNTER — Encounter: Payer: Self-pay | Admitting: *Deleted

## 2022-01-02 ENCOUNTER — Encounter: Payer: Self-pay | Admitting: Hematology & Oncology

## 2022-01-02 ENCOUNTER — Inpatient Hospital Stay: Payer: Medicare Other | Attending: Hematology & Oncology

## 2022-01-02 ENCOUNTER — Other Ambulatory Visit: Payer: Self-pay

## 2022-01-02 VITALS — BP 128/93 | HR 57 | Temp 98.2°F | Resp 18 | Ht 67.0 in | Wt 195.1 lb

## 2022-01-02 DIAGNOSIS — Z79899 Other long term (current) drug therapy: Secondary | ICD-10-CM | POA: Diagnosis not present

## 2022-01-02 DIAGNOSIS — K922 Gastrointestinal hemorrhage, unspecified: Secondary | ICD-10-CM | POA: Diagnosis not present

## 2022-01-02 DIAGNOSIS — D500 Iron deficiency anemia secondary to blood loss (chronic): Secondary | ICD-10-CM | POA: Insufficient documentation

## 2022-01-02 DIAGNOSIS — K909 Intestinal malabsorption, unspecified: Secondary | ICD-10-CM | POA: Diagnosis not present

## 2022-01-02 LAB — CMP (CANCER CENTER ONLY)
ALT: 15 U/L (ref 0–44)
AST: 22 U/L (ref 15–41)
Albumin: 4.7 g/dL (ref 3.5–5.0)
Alkaline Phosphatase: 53 U/L (ref 38–126)
Anion gap: 9 (ref 5–15)
BUN: 20 mg/dL (ref 8–23)
CO2: 29 mmol/L (ref 22–32)
Calcium: 9.9 mg/dL (ref 8.9–10.3)
Chloride: 102 mmol/L (ref 98–111)
Creatinine: 1.25 mg/dL — ABNORMAL HIGH (ref 0.61–1.24)
GFR, Estimated: >60 mL/min (ref >60)
Glucose, Bld: 110 mg/dL — ABNORMAL HIGH (ref 70–99)
Potassium: 3.3 mmol/L — ABNORMAL LOW (ref 3.5–5.1)
Sodium: 140 mmol/L (ref 135–145)
Total Bilirubin: 0.5 mg/dL (ref 0.3–1.2)
Total Protein: 7.3 g/dL (ref 6.5–8.1)

## 2022-01-02 LAB — RETICULOCYTES
Immature Retic Fract: 19.2 % — ABNORMAL HIGH (ref 2.3–15.9)
RBC.: 5.66 MIL/uL (ref 4.22–5.81)
Retic Count, Absolute: 83.8 10*3/uL (ref 19.0–186.0)
Retic Ct Pct: 1.5 % (ref 0.4–3.1)

## 2022-01-02 LAB — CBC WITH DIFFERENTIAL (CANCER CENTER ONLY)
Abs Immature Granulocytes: 0.01 10*3/uL (ref 0.00–0.07)
Basophils Absolute: 0 10*3/uL (ref 0.0–0.1)
Basophils Relative: 0 %
Eosinophils Absolute: 0.2 10*3/uL (ref 0.0–0.5)
Eosinophils Relative: 3 %
HCT: 47.1 % (ref 39.0–52.0)
Hemoglobin: 14.5 g/dL (ref 13.0–17.0)
Immature Granulocytes: 0 %
Lymphocytes Relative: 27 %
Lymphs Abs: 1.4 10*3/uL (ref 0.7–4.0)
MCH: 25.1 pg — ABNORMAL LOW (ref 26.0–34.0)
MCHC: 30.8 g/dL (ref 30.0–36.0)
MCV: 81.6 fL (ref 80.0–100.0)
Monocytes Absolute: 0.8 10*3/uL (ref 0.1–1.0)
Monocytes Relative: 14 %
Neutro Abs: 2.9 10*3/uL (ref 1.7–7.7)
Neutrophils Relative %: 56 %
Platelet Count: 162 10*3/uL (ref 150–400)
RBC: 5.77 MIL/uL (ref 4.22–5.81)
RDW: 24 % — ABNORMAL HIGH (ref 11.5–15.5)
WBC Count: 5.3 10*3/uL (ref 4.0–10.5)
nRBC: 0 % (ref 0.0–0.2)

## 2022-01-02 LAB — IRON AND IRON BINDING CAPACITY (CC-WL,HP ONLY)
Iron: 61 ug/dL (ref 45–182)
Saturation Ratios: 12 % — ABNORMAL LOW (ref 17.9–39.5)
TIBC: 507 ug/dL — ABNORMAL HIGH (ref 250–450)
UIBC: 446 ug/dL — ABNORMAL HIGH (ref 117–376)

## 2022-01-02 LAB — FERRITIN: Ferritin: 22 ng/mL — ABNORMAL LOW (ref 24–336)

## 2022-01-08 ENCOUNTER — Telehealth: Payer: Self-pay | Admitting: Internal Medicine

## 2022-01-08 NOTE — Telephone Encounter (Signed)
Patient called me back.  We reviewed things.  He is inclined to pursue a repeat endoscopy to reassess stomach polyps in the hiatal hernia.  We will schedule this for the [facility] at his convenience.  Please contact the patient and set this up.  It is not urgent.

## 2022-01-08 NOTE — Telephone Encounter (Signed)
Labs with Dr. Sandi Mariscal and Marin Olp showed normal hemoglobin his ferritin is improved though not normal yet  Left message that I would call him back to discuss possible repeat endoscopy like we had reviewed at his last office visit.  We were waiting for these blood work results to see.  The plan will be continue supportive iron therapy versus another survey of the upper GI tract with possible polypectomy.  Even if he has the latter I think he will need to continue iron therapy.

## 2022-01-08 NOTE — Telephone Encounter (Signed)
Left message for patient to call back  

## 2022-01-10 ENCOUNTER — Other Ambulatory Visit: Payer: Self-pay

## 2022-01-10 DIAGNOSIS — K449 Diaphragmatic hernia without obstruction or gangrene: Secondary | ICD-10-CM

## 2022-01-10 DIAGNOSIS — D500 Iron deficiency anemia secondary to blood loss (chronic): Secondary | ICD-10-CM

## 2022-01-10 DIAGNOSIS — K317 Polyp of stomach and duodenum: Secondary | ICD-10-CM

## 2022-01-10 NOTE — Telephone Encounter (Signed)
Patient has been scheduled for an EGD in the [facility] with Dr. Carlean Purl on 01/26/22 at 1:30 pm. He is aware to arrive 1 hour prior & to have a caregiver present for procedure. He is not on any blood thinners or diabetic. Instructions have been sent to patient's mychart. Patient advised to call back with any questions.

## 2022-01-26 ENCOUNTER — Ambulatory Visit (AMBULATORY_SURGERY_CENTER): Payer: Medicare Other | Admitting: Internal Medicine

## 2022-01-26 ENCOUNTER — Encounter: Payer: Self-pay | Admitting: Internal Medicine

## 2022-01-26 VITALS — BP 112/57 | HR 50 | Temp 98.9°F | Resp 15 | Ht 67.0 in | Wt 195.0 lb

## 2022-01-26 DIAGNOSIS — K317 Polyp of stomach and duodenum: Secondary | ICD-10-CM | POA: Diagnosis not present

## 2022-01-26 DIAGNOSIS — K2970 Gastritis, unspecified, without bleeding: Secondary | ICD-10-CM

## 2022-01-26 DIAGNOSIS — D500 Iron deficiency anemia secondary to blood loss (chronic): Secondary | ICD-10-CM

## 2022-01-26 DIAGNOSIS — K449 Diaphragmatic hernia without obstruction or gangrene: Secondary | ICD-10-CM

## 2022-01-26 MED ORDER — SODIUM CHLORIDE 0.9 % IV SOLN: 500.0000 mL | Freq: Once | INTRAVENOUS | Status: DC

## 2022-01-26 NOTE — Progress Notes (Signed)
VSS, transported to PACU °

## 2022-01-26 NOTE — Progress Notes (Signed)
Called to room to assist during endoscopic procedure.  Patient ID and intended procedure confirmed with present staff. Received instructions for my participation in the procedure from the performing physician.  

## 2022-01-26 NOTE — Progress Notes (Signed)
[facility] History and Physical   Primary Care Physician:  [name], MD   Reason for Procedure:  Follow-up gastric polyps, hiatal hernia in the setting of iron deficiency anemia  Plan:    EGD     HPI: Nathaniel Grimes is a 71 y.o. male with a history of iron deficiency anemia of unclear etiology though thought at least in part due to gastric polyps and possibly a large hiatal hernia.  He had multiple benign fundic gland polyps resected in 2021.  His iron deficiency recurred, he has been treated and his parameters have improved.  He is here for reassessment given that we were not 767% certain as to the cause.  A colonoscopic source was not found.   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 Authorizing Provider  allopurinol (ZYLOPRIM) 300 MG tablet Take 150 mg by mouth daily. 11/27/19  Yes [provider]  chlorthalidone (HYGROTON) 25 MG tablet Take 25 mg by mouth every morning. 11/27/19  Yes [provider]  ferrous sulfate 325 (65 FE) MG EC tablet Take 1 tablet (325 mg total) by mouth daily with breakfast. 02/25/20  Yes Armbruster,  Carlota Raspberry, MD  Glucosamine-Chondroitin 500-400 MG CAPS Take 1 tablet by mouth daily. 09/01/20  Yes [provider]  lisinopril (ZESTRIL) 10 MG tablet Take 10 mg by mouth daily. 12/25/19  Yes [provider]  omeprazole (PRILOSEC) 20 MG capsule Take 20 mg by mouth 2 (two) times daily before a meal. 12/25/19  Yes [provider]  potassium chloride (KLOR-CON) 10 MEQ tablet Take 10 mEq by mouth 4 (four) times daily. 12/25/19  Yes [provider]  rosuvastatin (CRESTOR) 20 MG tablet Take 20 mg by mouth at bedtime. 12/22/19  Yes [provider]  tadalafil (CIALIS) 5 MG tablet Take 5 mg by mouth daily. 01/04/20  Yes [provider]  Acetaminophen (TYLENOL 8 HOUR PO) Take 1 tablet by mouth every 6 (six) hours as needed.    [provider]  ALPRAZolam Duanne Moron) 0.5 MG tablet Take 0.5 mg by mouth daily as needed. 12/25/19   [provider]  ondansetron (ZOFRAN) 4 MG tablet Take 1 tablet (4 mg total) by mouth every 8 (eight) hours as needed for nausea or vomiting (every 6 hours as needed). May take very 6-8 hours as needed 02/25/20   Armbruster, Carlota Raspberry, MD    Current Outpatient Medications  Medication Sig Dispense Refill   allopurinol (ZYLOPRIM) 300 MG tablet Take 150 mg by mouth daily.  chlorthalidone (HYGROTON) 25 MG tablet Take 25 mg by mouth every morning.     ferrous sulfate 325 (65 FE) MG EC tablet Take 1 tablet (325 mg total) by mouth daily with breakfast. 90 tablet 3   Glucosamine-Chondroitin 500-400 MG CAPS Take 1 tablet by mouth daily.     lisinopril (ZESTRIL) 10 MG tablet Take 10 mg by mouth daily.     omeprazole (PRILOSEC) 20 MG capsule Take 20 mg by mouth 2 (two) times daily before a meal.     potassium chloride (KLOR-CON) 10 MEQ tablet Take 10 mEq by mouth 4 (four) times daily.     rosuvastatin (CRESTOR) 20 MG tablet Take 20 mg by mouth at bedtime.     tadalafil (CIALIS) 5 MG tablet Take 5 mg by mouth daily.      Acetaminophen (TYLENOL 8 HOUR PO) Take 1 tablet by mouth every 6 (six) hours as needed.     ALPRAZolam (XANAX) 0.5 MG tablet Take 0.5 mg by mouth daily as needed.     ondansetron (ZOFRAN) 4 MG tablet Take 1 tablet (4 mg total) by mouth every 8 (eight) hours as needed for nausea or vomiting (every 6 hours as needed). May take very 6-8 hours as needed 10 tablet 0   Current Facility-Administered Medications  Medication Dose Route Frequency Provider Last Rate Last Admin   0.9 %  sodium chloride infusion  500 mL Intravenous Once Gatha Mayer, MD        Allergies as of 01/26/2022   (No Known Allergies)    Family History  Problem Relation Age of Onset   Breast cancer Mother    Kidney Stones Father        chronic   Breast cancer Sister    Diabetes Brother    Hypertension Brother    Sleep apnea Brother    Kidney Stones Brother    Sleep apnea Brother    Kidney Stones Brother    Hiatal hernia Maternal Grandfather    Heart attack Maternal Grandfather    Heart attack Paternal Grandfather    Colon cancer Neg Hx    Esophageal cancer Neg Hx    Rectal cancer Neg Hx    Stomach cancer Neg Hx     Social History   Socioeconomic History   Marital status: Married    Spouse name: Not on file   Number of children: 4   Years of education: Not on file   Highest education level: Not on file  Occupational History   Occupation: Press photographer  Tobacco Use   Smoking status: Never   Smokeless tobacco: Never  Vaping Use   Vaping Use: Never used  Substance and Sexual Activity   Alcohol use: Yes    Comment: < 1 per day   Drug use: Never   Sexual activity: Not on file  Other Topics Concern   Not on file  Social History Narrative   Married to [place] in [place], is in [place] frequently for work, he is employed in Nutritional therapist.   4 daughters 10 grandchildren   Occasional alcohol never smoker 2 caffeinated beverages a day no other tobacco   Social Determinants of Adult nurse Strain: Not on file  Food Insecurity: Not on file  Transportation Needs: Not on file  Physical Activity: Not on file  Stress: Not on file  Social Connections: Not on file  Intimate Partner Violence: Not on file    Review of Systems:  All other review of systems negative except as mentioned in the HPI.  Physical Exam: Vital signs BP 137/79   Pulse (!) 55   Temp 98.9 F (37.2 C) (Temporal)   Ht '5\' 7"'$  (1.702 m)   Wt 195 lb (88.5 kg)   SpO2 98%   BMI 30.54 kg/m   General:   Alert,  Well-developed, well-nourished, pleasant and cooperative in NAD Lungs:  Clear throughout to auscultation.   Heart:  Regular rate and rhythm; no murmurs, clicks, rubs,  or gallops. Abdomen:  Soft, nontender and nondistended. Normal bowel sounds.   Neuro/Psych:  Alert and cooperative. Normal mood and affect. A and O x 3   '@Anaiya Wisinski'$  Simonne Maffucci, MD, [facility] [phone] (pager) 01/26/2022 1:20 PM@

## 2022-01-26 NOTE — Patient Instructions (Addendum)
Read all of the handouts given to you by your recovery room nurse.  YOU HAD AN ENDOSCOPIC PROCEDURE TODAY AT [facility]:   Refer to the procedure report that was given to you for any specific questions about what was found during the examination.  If the procedure report does not answer your questions, please call your gastroenterologist to clarify.  If you requested that your care partner not be given the details of your procedure findings, then the procedure report has been included in a sealed envelope for you to review at your convenience later.  YOU SHOULD EXPECT: Some feelings of bloating in the abdomen. Passage of more gas than usual.  Walking can help get rid of the air that was put into your GI tract during the procedure and reduce the bloating.   Please Note:  You might notice some irritation and congestion in your nose or some drainage.  This is from the oxygen used during your procedure.  There is no need for concern and it should clear up in a day or so.  SYMPTOMS TO REPORT IMMEDIATELY:   Following upper endoscopy (EGD)  Vomiting of blood or coffee ground material  New chest pain or pain under the shoulder blades  Painful or persistently difficult swallowing  New shortness of breath  Fever of 100F or higher  Black, tarry-looking stools  For urgent or emergent issues, a gastroenterologist can be reached at any hour by calling [phone]. Do not use MyChart messaging for urgent concerns.    DIET:  We do recommend a small meal at first, but then you may proceed to your regular diet.  Drink plenty of fluids but you should avoid alcoholic beverages for 24 hours. Try to decrease the acid intake in your fluids and meals.  ACTIVITY:  You should plan to take it easy for the rest of today and you should NOT DRIVE or use heavy machinery until tomorrow (because of the sedation medicines used during the test).    FOLLOW UP: Our staff will call the number listed on  your records the next business day following your procedure.  We will call around 7:15- 8:00 am to check on you and address any questions or concerns that you may have regarding the information given to you following your procedure. If we do not reach you, we will leave a message.  If you develop any symptoms (ie: fever, flu-like symptoms, shortness of breath, cough etc.) before then, please call [phone].  If you test positive for Covid 19 in the 2 weeks post procedure, please call and report this information to [place].    If any biopsies were taken you will be contacted by phone or by letter within the next 1-3 weeks.  Please call us at [phone] if you have not heard about the biopsies in 3 weeks.    SIGNATURES/CONFIDENTIALITY: You and/or your care partner have signed paperwork which will be entered into your electronic medical record.  These signatures attest to the fact that that the information above on your After Visit Summary has been reviewed and is understood.  Full responsibility of the confidentiality of this discharge information lies with you and/or your care-partner.

## 2022-01-26 NOTE — Op Note (Addendum)
[name] Patient Name: Nathaniel Grimes Procedure Date: 01/26/2022 1:20 PM MRN: [id number] Endoscopist: Gatha Mayer , MD Age: 71 Referring MD:  Date of Birth: April 17, 1951 Gender: Male Account #: [id number] Procedure:                Upper GI endoscopy Indications:              Iron deficiency anemia secondary to chronic blood                            loss, Follow-up of gastric polyps, Hiatal hernia Medicines:                Monitored Anesthesia Care Procedure:                Pre-Anesthesia Assessment:                           - Prior to the procedure, a History and Physical                            was performed, and patient medications and                            allergies were reviewed. The patient's tolerance of                            previous anesthesia was also reviewed. The risks                            and benefits of the procedure and the sedation                            options and risks were discussed with the patient.                            All questions were answered, and informed consent                            was obtained. Prior Anticoagulants: The patient has                            taken no previous anticoagulant or antiplatelet                            agents. ASA Grade Assessment: III - A patient with                            severe systemic disease. After reviewing the risks                            and benefits, the patient was deemed in                            satisfactory condition to undergo the procedure.  After obtaining informed consent, the endoscope was                            passed under direct vision. Throughout the                            procedure, the patient's blood pressure, pulse, and                            oxygen saturations were monitored continuously. The                            Endoscope was introduced through the mouth, and                             advanced to the second part of duodenum. The upper                            GI endoscopy was accomplished without difficulty.                            The patient tolerated the procedure well. Scope In: Scope Out: Findings:                 The examined esophagus was mildly tortuous.                           A 9 cm hiatal hernia was found. The hiatal                            narrowing was 43 cm from the incisors. The Z-line                            was 34 cm from the incisors.                           Patchy moderate inflammation characterized by                            congestion (edema), erythema, friability and                            granularity was found in the cardia, in the gastric                            body and in the gastric antrum. Biopsies were taken                            with a cold forceps for histology. Verification of                            patient identification for the specimen was done.  Estimated blood loss was minimal.                           Multiple 3 to 15 mm sessile polyps with no bleeding                            and no stigmata of recent bleeding were found in                            the gastric fundus and in the gastric body.                           The exam was otherwise without abnormality.                           The cardia and gastric fundus were normal on                            retroflexion. Complications:            No immediate complications. Estimated Blood Loss:     Estimated blood loss was minimal. Impression:               - Tortuous esophagus.                           - 9 cm hiatal hernia.                           - Gastritis. Biopsied. in hernia sac and antrum and                            friable w/ ooze of blood - this seems a likely                            candidate for blood loss                           - Multiple gastric polyps. 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                           - The examination was otherwise normal. Recommendation:           - Patient has a contact number available for                            emergencies. The signs and symptoms of potential                            delayed complications  were discussed with the                            patient. Return to normal activities tomorrow.                            Written discharge instructions were provided to the                            patient.                           - Resume previous diet.                           - Continue present medications. Consider increasing                            PPI dose after path review                           - Await pathology results.                           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

## 2022-01-29 ENCOUNTER — Telehealth: Payer: Self-pay | Admitting: *Deleted

## 2022-01-29 NOTE — Telephone Encounter (Signed)
No answer on Follow up call. Left message.

## 2022-12-14 ENCOUNTER — Other Ambulatory Visit: Payer: Self-pay | Admitting: Family Medicine

## 2022-12-14 ENCOUNTER — Other Ambulatory Visit: Payer: Self-pay

## 2022-12-14 ENCOUNTER — Encounter: Payer: Self-pay | Admitting: Hematology & Oncology

## 2022-12-14 DIAGNOSIS — R1011 Right upper quadrant pain: Secondary | ICD-10-CM

## 2022-12-17 ENCOUNTER — Ambulatory Visit
Admission: RE | Admit: 2022-12-17 | Discharge: 2022-12-17 | Disposition: A | Discharge status: Home / Self Care | Payer: Medicare Other | Source: Ambulatory Visit | Attending: Family Medicine | Admitting: Family Medicine

## 2022-12-17 DIAGNOSIS — R1011 Right upper quadrant pain: Secondary | ICD-10-CM

## 2023-02-07 HISTORY — PX: BROW LIFT: SHX178

## 2023-02-07 IMAGING — DX DG CHEST 2V
2 series · 2 of 2 positions shown · non-contrast
Comparison: August 28, 2013.

CLINICAL DATA: Fever, cough.

EXAM:
CHEST - 2 VIEW

[dg chest 2 view (1 of 2)]
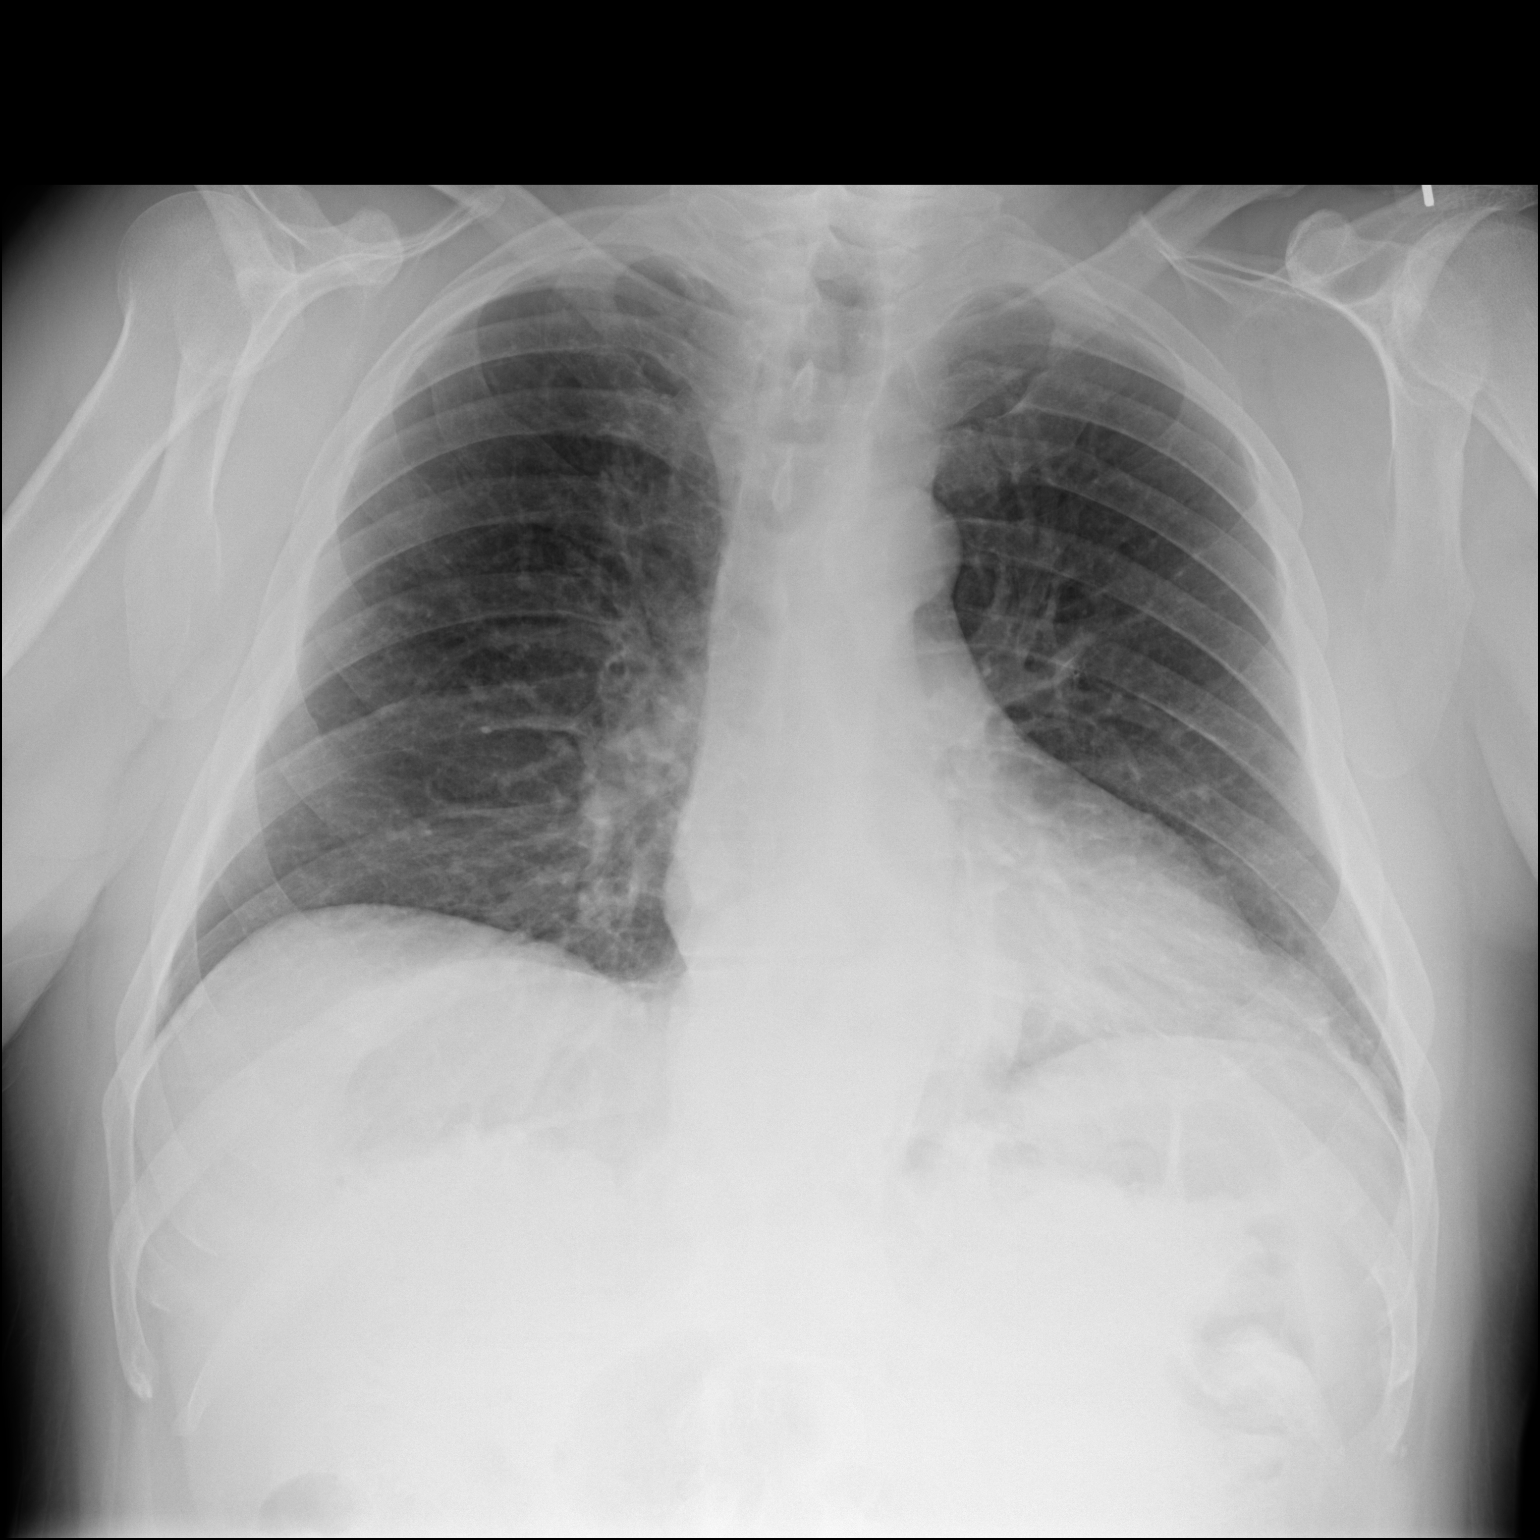

[dg chest 2 view (2 of 2)]
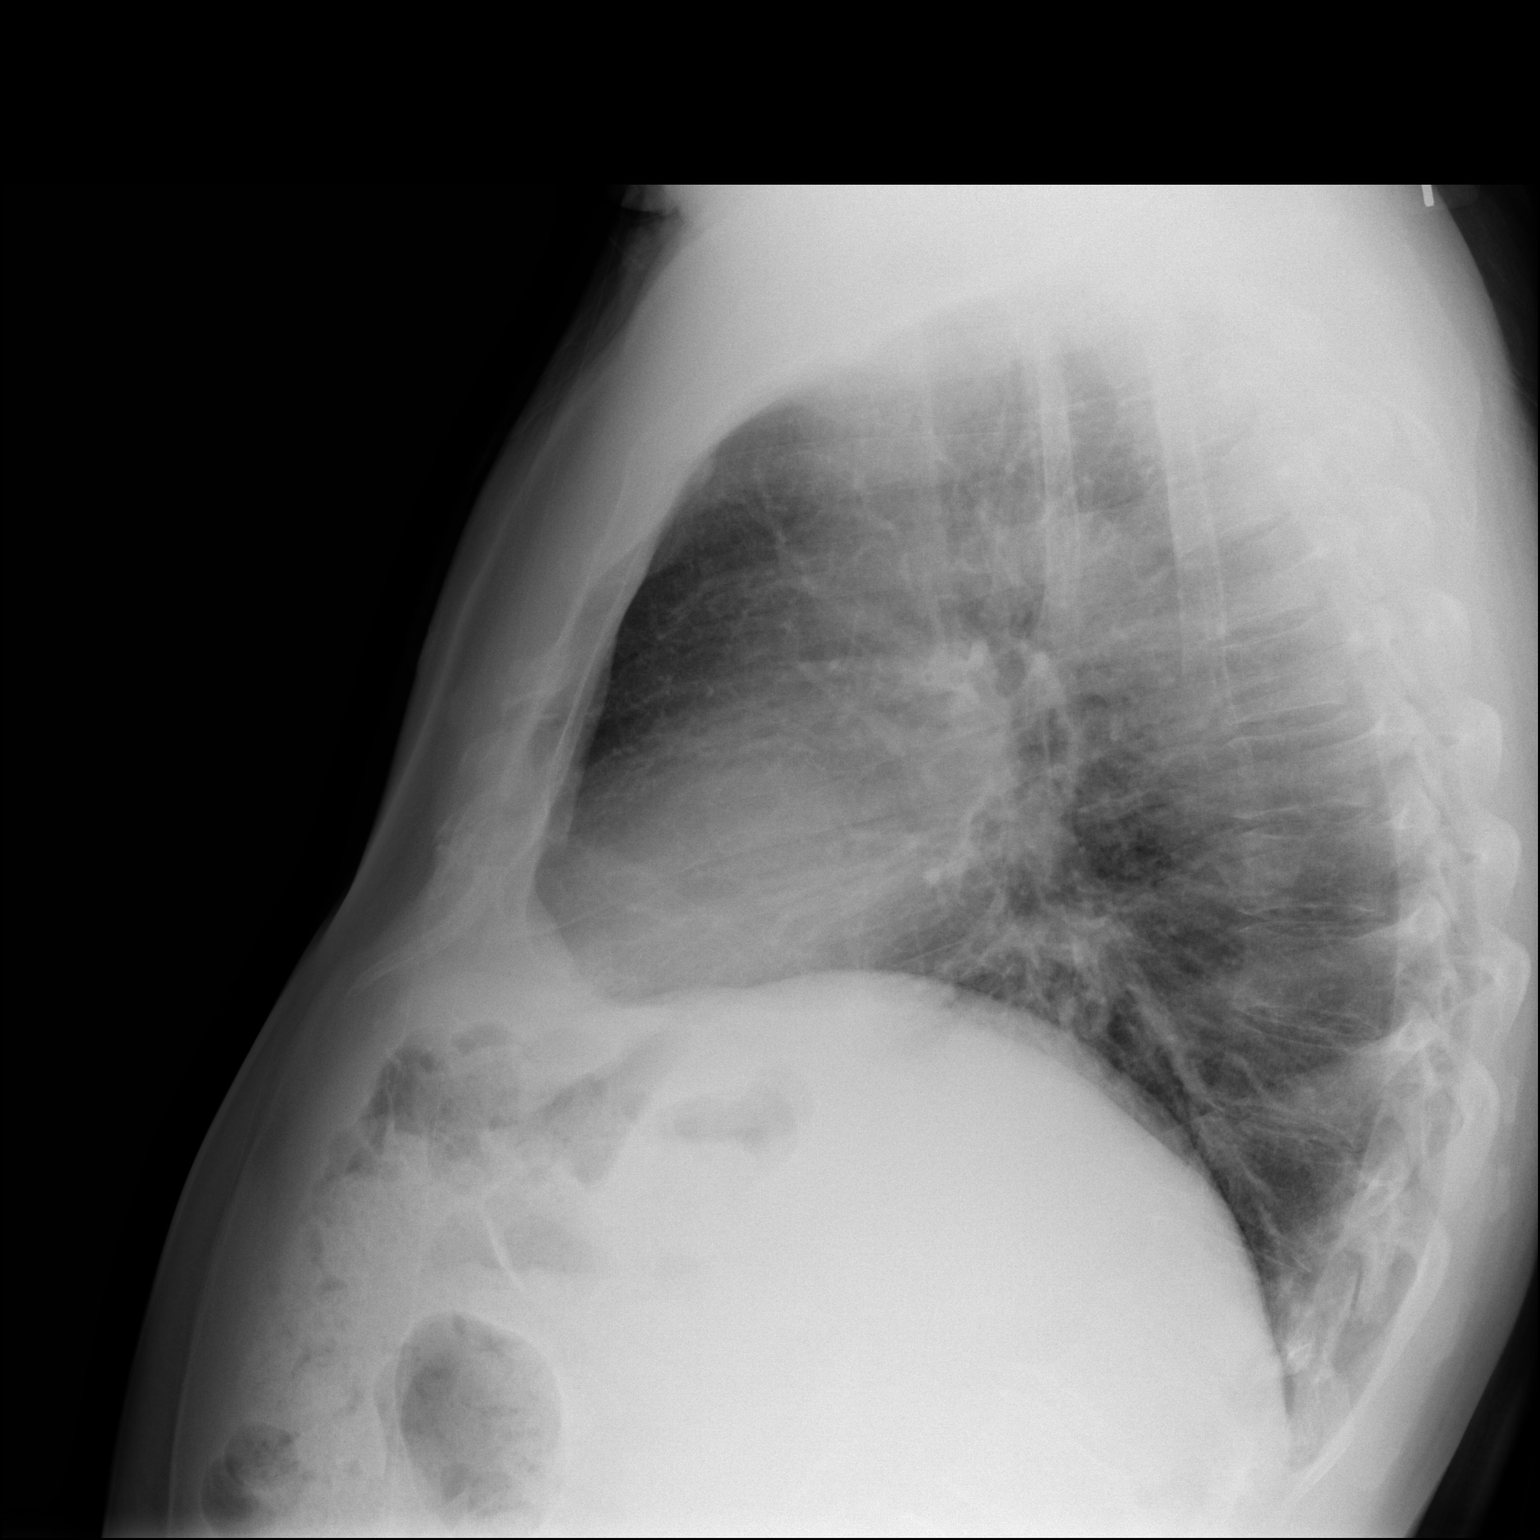

[2 of 2 positions shown; findings below may reference images not displayed]

FINDINGS: The heart size and mediastinal contours are within normal limits.
Both lungs are clear. The visualized skeletal structures are
unremarkable.
IMPRESSION: No active cardiopulmonary disease.

## 2023-03-14 ENCOUNTER — Telehealth: Payer: Self-pay | Admitting: Internal Medicine

## 2023-03-14 NOTE — Telephone Encounter (Signed)
Called patient to explain I was following up after his GI bleed and EGD in [facility].  I have reviewed the records.  He is requesting that I perform the next EGD to follow-up his GI bleed and potentially remove polyps.  I have asked him to call me back to discuss his situation and see if I can help him versus having follow-up care in [facility]  He has a known large hiatal hernia and the endoscopist in [facility] did discover Sheria Lang erosions which were not seen in the past.  Consideration for hiatal hernia repair is appropriate.

## 2023-03-14 NOTE — Telephone Encounter (Signed)
I spoke to the patient.  A little more info he had had his eyebrow surgery, and had a lot of vomiting after the anesthesia.  Then he took some ibuprofen after the surgery and then he had his GI bleed and was admitted in [facility].  He is feeling stronger though his hemoglobin is ranging from 9.5-9 0.6-9.4 since release in late August.  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 pylori.  I do not have those results.   It could be that he did not have Cameron's erosions that he had some traumatic erosions from the postoperative/post anesthesia nausea and vomiting that he had plus or minus ibuprofen effect.  I had not seen Cameron erosions in the past though they can come and go.   He would like me to perform his next endoscopy.  We can set that up for a direct EGD in October.   He will stay on PPI twice daily, Dr. Duaine Dredge is coordinating an iron infusion most likely I think that is a good idea because the patient has had intolerance to oral iron in the past.   The patient understands I may not be able to remove gastric polyps at his procedure in the [facility].  He knows it could require hospital procedure at a later date.  Please do the following:  Arrange for an EGD in the [facility] with me in October.  A phone call for the Pre visit would be fine most likely though he comes back and forth to [facility] frequently (he lives at [facility])

## 2023-03-14 NOTE — Telephone Encounter (Signed)
Scheduled EGD with patient for 04/11/23 at 2:30 pm in the [facility] with Dr. Leone Payor. PV 04/05/23 at 11:00 am via phone. Pt states he is not on any blood thinners/diabetic.

## 2023-03-15 ENCOUNTER — Telehealth: Payer: Self-pay

## 2023-03-15 ENCOUNTER — Other Ambulatory Visit: Payer: Self-pay

## 2023-03-15 DIAGNOSIS — K909 Intestinal malabsorption, unspecified: Secondary | ICD-10-CM

## 2023-03-15 NOTE — Progress Notes (Signed)
Orders needed for Iron and Ferritin. PCP contacted office and is requesting an iron infusion. No iron studies drawn recently. Pt requested to have these drawn at [facility]. Need labs before scheduling iron infusion. Pt can receive venofer, feraheme and ferrlecit. Orders faxed and copy posted to pt's MyChart per his request. Pt advised to schedule lab appointment and will await results.

## 2023-03-15 NOTE — Telephone Encounter (Signed)
Iron orders sent to MyChart per pt request.

## 2023-03-21 ENCOUNTER — Encounter: Payer: Self-pay | Admitting: Medical Oncology

## 2023-03-21 ENCOUNTER — Inpatient Hospital Stay: Payer: Medicare Other | Attending: Medical Oncology | Admitting: Medical Oncology

## 2023-03-21 ENCOUNTER — Inpatient Hospital Stay: Payer: Medicare Other

## 2023-03-21 ENCOUNTER — Other Ambulatory Visit: Payer: Self-pay

## 2023-03-21 VITALS — BP 135/75 | HR 61 | Temp 97.9°F | Resp 18 | Ht 66.93 in

## 2023-03-21 VITALS — BP 135/67 | HR 60 | Resp 18

## 2023-03-21 DIAGNOSIS — K922 Gastrointestinal hemorrhage, unspecified: Secondary | ICD-10-CM | POA: Insufficient documentation

## 2023-03-21 DIAGNOSIS — K909 Intestinal malabsorption, unspecified: Secondary | ICD-10-CM

## 2023-03-21 DIAGNOSIS — D500 Iron deficiency anemia secondary to blood loss (chronic): Secondary | ICD-10-CM

## 2023-03-21 DIAGNOSIS — R1319 Other dysphagia: Secondary | ICD-10-CM

## 2023-03-21 DIAGNOSIS — Z79899 Other long term (current) drug therapy: Secondary | ICD-10-CM | POA: Diagnosis not present

## 2023-03-21 DIAGNOSIS — Z884 Allergy status to anesthetic agent status: Secondary | ICD-10-CM | POA: Diagnosis not present

## 2023-03-21 DIAGNOSIS — R5383 Other fatigue: Secondary | ICD-10-CM | POA: Insufficient documentation

## 2023-03-21 MED ORDER — SODIUM CHLORIDE 0.9 % IV SOLN
300.0000 mg | Freq: Once | INTRAVENOUS | Status: AC
  Administered 2023-03-21: 300 mg via INTRAVENOUS
  Filled 2023-03-21: qty 300

## 2023-03-21 MED ORDER — SODIUM CHLORIDE 0.9 % IV SOLN: Freq: Once | INTRAVENOUS | Status: AC

## 2023-03-21 NOTE — Progress Notes (Signed)
Hematology and Oncology Follow Up Visit  Nathaniel Grimes [id number] 01-30-1951 72 y.o. 03/29/2023   Principle Diagnosis:  Iron deficiency anemia-GI blood loss  Current Therapy:   Oral iron/vitamin C     Interim History:  Nathaniel Grimes is back for follow-up.    Since his last visit he has had an unfortunately eventful time. He underwent eyelid surgery. Unfortunately he had an allergic reaction to the general anesthesia. He then had a GI bleed and was seen at the emergency room in [facility]. This all acured around the end of August.   Bleeding has stopped to his knowledge but he is still fatigued. He has been on oral iron in the past but wishes to have IV iron given the recent GI bleed.   He has had no fever.  He has had no cough or shortness of breath.  He has had no obvious melena or bright red blood per rectum.  Overall, I would say his performance status is probably ECOG 0.  Medications:  Current Outpatient Medications:    Acetaminophen (TYLENOL 8 HOUR PO), Take 1 tablet by mouth every 6 (six) hours as needed., Disp: , Rfl:    allopurinol (ZYLOPRIM) 300 MG tablet, Take 150 mg by mouth daily., Disp: , Rfl:    ALPRAZolam (XANAX) 0.5 MG tablet, Take 0.5 mg by mouth daily as needed., Disp: , Rfl:    ferrous sulfate ER (SLOW FE) 142 (45 Fe) MG TBCR tablet, Take 1 tablet by mouth daily., Disp: , Rfl:    Glucosamine-Chondroitin 500-400 MG CAPS, Take 1 tablet by mouth daily., Disp: , Rfl:    pantoprazole (PROTONIX) 40 MG tablet, Take 40 mg by mouth 2 (two) times daily., Disp: , Rfl:    potassium chloride (KLOR-CON) 10 MEQ tablet, Take 10 mEq by mouth 4 (four) times daily., Disp: , Rfl:    rosuvastatin (CRESTOR) 20 MG tablet, Take 20 mg by mouth at bedtime., Disp: , Rfl:    tadalafil (CIALIS) 5 MG tablet, Take 5 mg by mouth daily., Disp: , Rfl:    ondansetron (ZOFRAN) 4 MG tablet, Take 1 tablet (4 mg total) by mouth every 8 (eight) hours as needed for nausea or vomiting (every 6 hours  as needed). May take very 6-8 hours as needed (Patient not taking: Reported on 03/21/2023), Disp: 10 tablet, Rfl: 0  Allergies:  Allergies  Allergen Reactions   Other Nausea And Vomiting    Anesthesia used for a brow lift. Severe nausea and vomiting in the recovery room.    Past Medical History, Surgical history, Social history, and Family History were reviewed and updated.  Review of Systems: Review of Systems  Constitutional: Negative.   HENT:  Negative.    Eyes: Negative.   Respiratory: Negative.    Cardiovascular: Negative.   Gastrointestinal: Negative.   Endocrine: Negative.   Genitourinary: Negative.    Musculoskeletal: Negative.   Skin: Negative.   Neurological: Negative.   Hematological: Negative.   Psychiatric/Behavioral: Negative.      Physical Exam:  height is 5' 6.93" (1.7 m). His oral temperature is 97.9 F (36.6 C). His blood pressure is 135/75 and his pulse is 61. His respiration is 18 and oxygen saturation is 100%.   Wt Readings from Last 3 Encounters:  01/26/22 195 lb (88.5 kg)  01/02/22 195 lb 1.9 oz (88.5 kg)  12/13/21 196 lb (88.9 kg)    Physical Exam Vitals reviewed.  HENT:     Head: Normocephalic and atraumatic.  Eyes:  Pupils: Pupils are equal, round, and reactive to light.  Cardiovascular:     Rate and Rhythm: Normal rate and regular rhythm.     Heart sounds: Normal heart sounds.  Pulmonary:     Effort: Pulmonary effort is normal.     Breath sounds: Normal breath sounds.  Abdominal:     General: Bowel sounds are normal.     Palpations: Abdomen is soft.  Musculoskeletal:        General: No tenderness or deformity. Normal range of motion.     Cervical back: Normal range of motion.  Lymphadenopathy:     Cervical: No cervical adenopathy.  Skin:    General: Skin is warm and dry.     Findings: No erythema or rash.  Neurological:     Mental Status: He is alert and oriented to person, place, and time.  Psychiatric:        Behavior:  Behavior normal.        Thought Content: Thought content normal.        Judgment: Judgment normal.      Lab Results  Component Value Date   WBC 5.3 01/02/2022   HGB 14.5 01/02/2022   HCT 47.1 01/02/2022   MCV 81.6 01/02/2022   PLT 162 01/02/2022     Chemistry      Component Value Date/Time   NA 140 01/02/2022 0923   K 3.3 (L) 01/02/2022 0923   CL 102 01/02/2022 0923   CO2 29 01/02/2022 0923   BUN 20 01/02/2022 0923   CREATININE 1.25 (H) 01/02/2022 0923      Component Value Date/Time   CALCIUM 9.9 01/02/2022 0923   ALKPHOS 53 01/02/2022 0923   AST 22 01/02/2022 0923   ALT 15 01/02/2022 0923   BILITOT 0.5 01/02/2022 0923       Impression and Plan: Nathaniel Grimes is a very nice 72 year old white male.  Previously on IV iron PRN for GI loss however he was transitioned onto oral iron at his last visit with our office.   Since his last visit he has had a hospitalization and subsequent blood loss. Oral iron is not sufficient based off of trending labs. He requests IV iron which he has previously tolerated well. He is scheduled for this today.   Disposition IV Venofer 300 mg weekly x 3 weeks total starting today RTC 1 month APP, labs(CBC w/, CMP, iron, ferritin, retic), +- Venofer-[place]   Rushie Chestnut, PA-C 9/20/20242:33 PM

## 2023-03-21 NOTE — Patient Instructions (Signed)
Iron Sucrose Injection What is this medication? IRON SUCROSE (EYE ern SOO krose) treats low levels of iron (iron deficiency anemia) in people with kidney disease. Iron is a mineral that plays an important role in making red blood cells, which carry oxygen from your lungs to the rest of your body. This medicine may be used for other purposes; ask your health care provider or pharmacist if you have questions. COMMON BRAND NAME(S): Venofer What should I tell my care team before I take this medication? They need to know if you have any of these conditions: Anemia not caused by low iron levels Heart disease High levels of iron in the blood Kidney disease Liver disease An unusual or allergic reaction to iron, other medications, foods, dyes, or preservatives Pregnant or trying to get pregnant Breastfeeding How should I use this medication? This medication is for infusion into a vein. It is given in a hospital or clinic setting. Talk to your care team about the use of this medication in children. While this medication may be prescribed for children as young as 2 years for selected conditions, precautions do apply. Overdosage: If you think you have taken too much of this medicine contact a poison control center or emergency room at once. NOTE: This medicine is only for you. Do not share this medicine with others. What if I miss a dose? Keep appointments for follow-up doses. It is important not to miss your dose. Call your care team if you are unable to keep an appointment. What may interact with this medication? Do not take this medication with any of the following: Deferoxamine Dimercaprol Other iron products This medication may also interact with the following: Chloramphenicol Deferasirox This list may not describe all possible interactions. Give your health care provider a list of all the medicines, herbs, non-prescription drugs, or dietary supplements you use. Also tell them if you smoke,  drink alcohol, or use illegal drugs. Some items may interact with your medicine. What should I watch for while using this medication? Visit your care team regularly. Tell your care team if your symptoms do not start to get better or if they get worse. You may need blood work done while you are taking this medication. You may need to follow a special diet. Talk to your care team. Foods that contain iron include: whole grains/cereals, dried fruits, beans, or peas, leafy green vegetables, and organ meats (liver, kidney). What side effects may I notice from receiving this medication? Side effects that you should report to your care team as soon as possible: Allergic reactions--skin rash, itching, hives, swelling of the face, lips, tongue, or throat Low blood pressure--dizziness, feeling faint or lightheaded, blurry vision Shortness of breath Side effects that usually do not require medical attention (report to your care team if they continue or are bothersome): Flushing Headache Joint pain Muscle pain Nausea Pain, redness, or irritation at injection site This list may not describe all possible side effects. Call your doctor for medical advice about side effects. You may report side effects to FDA at 1-800-FDA-1088. Where should I keep my medication? This medication is given in a hospital or clinic. It will not be stored at home. NOTE: This sheet is a summary. It may not cover all possible information. If you have questions about this medicine, talk to your doctor, pharmacist, or health care provider.  2024 Elsevier/Gold Standard (2022-11-30 00:00:00)

## 2023-03-28 ENCOUNTER — Inpatient Hospital Stay: Payer: Medicare Other

## 2023-03-28 VITALS — BP 143/82 | HR 65 | Temp 97.5°F | Resp 17

## 2023-03-28 DIAGNOSIS — D500 Iron deficiency anemia secondary to blood loss (chronic): Secondary | ICD-10-CM | POA: Diagnosis not present

## 2023-03-28 DIAGNOSIS — K909 Intestinal malabsorption, unspecified: Secondary | ICD-10-CM

## 2023-03-28 MED ORDER — SODIUM CHLORIDE 0.9 % IV SOLN
300.0000 mg | Freq: Once | INTRAVENOUS | Status: AC
  Administered 2023-03-28: 300 mg via INTRAVENOUS
  Filled 2023-03-28: qty 300

## 2023-03-28 MED ORDER — SODIUM CHLORIDE 0.9 % IV SOLN: Freq: Once | INTRAVENOUS | Status: AC

## 2023-03-28 NOTE — Patient Instructions (Signed)
Iron Sucrose Injection What is this medication? IRON SUCROSE (EYE ern SOO krose) treats low levels of iron (iron deficiency anemia) in people with kidney disease. Iron is a mineral that plays an important role in making red blood cells, which carry oxygen from your lungs to the rest of your body. This medicine may be used for other purposes; ask your health care provider or pharmacist if you have questions. COMMON BRAND NAME(S): Venofer What should I tell my care team before I take this medication? They need to know if you have any of these conditions: Anemia not caused by low iron levels Heart disease High levels of iron in the blood Kidney disease Liver disease An unusual or allergic reaction to iron, other medications, foods, dyes, or preservatives Pregnant or trying to get pregnant Breastfeeding How should I use this medication? This medication is for infusion into a vein. It is given in a hospital or clinic setting. Talk to your care team about the use of this medication in children. While this medication may be prescribed for children as young as 2 years for selected conditions, precautions do apply. Overdosage: If you think you have taken too much of this medicine contact a poison control center or emergency room at once. NOTE: This medicine is only for you. Do not share this medicine with others. What if I miss a dose? Keep appointments for follow-up doses. It is important not to miss your dose. Call your care team if you are unable to keep an appointment. What may interact with this medication? Do not take this medication with any of the following: Deferoxamine Dimercaprol Other iron products This medication may also interact with the following: Chloramphenicol Deferasirox This list may not describe all possible interactions. Give your health care provider a list of all the medicines, herbs, non-prescription drugs, or dietary supplements you use. Also tell them if you smoke,  drink alcohol, or use illegal drugs. Some items may interact with your medicine. What should I watch for while using this medication? Visit your care team regularly. Tell your care team if your symptoms do not start to get better or if they get worse. You may need blood work done while you are taking this medication. You may need to follow a special diet. Talk to your care team. Foods that contain iron include: whole grains/cereals, dried fruits, beans, or peas, leafy green vegetables, and organ meats (liver, kidney). What side effects may I notice from receiving this medication? Side effects that you should report to your care team as soon as possible: Allergic reactions--skin rash, itching, hives, swelling of the face, lips, tongue, or throat Low blood pressure--dizziness, feeling faint or lightheaded, blurry vision Shortness of breath Side effects that usually do not require medical attention (report to your care team if they continue or are bothersome): Flushing Headache Joint pain Muscle pain Nausea Pain, redness, or irritation at injection site This list may not describe all possible side effects. Call your doctor for medical advice about side effects. You may report side effects to FDA at 1-800-FDA-1088. Where should I keep my medication? This medication is given in a hospital or clinic. It will not be stored at home. NOTE: This sheet is a summary. It may not cover all possible information. If you have questions about this medicine, talk to your doctor, pharmacist, or health care provider.  2024 Elsevier/Gold Standard (2022-11-30 00:00:00)

## 2023-03-29 ENCOUNTER — Encounter: Payer: Self-pay | Admitting: Hematology & Oncology

## 2023-04-01 ENCOUNTER — Other Ambulatory Visit: Payer: Self-pay | Admitting: Medical Oncology

## 2023-04-02 ENCOUNTER — Encounter: Payer: Self-pay | Admitting: Hematology & Oncology

## 2023-04-04 ENCOUNTER — Inpatient Hospital Stay: Payer: Medicare Other

## 2023-04-04 VITALS — BP 143/70 | HR 54 | Temp 97.8°F | Resp 18

## 2023-04-04 DIAGNOSIS — D500 Iron deficiency anemia secondary to blood loss (chronic): Secondary | ICD-10-CM | POA: Diagnosis not present

## 2023-04-04 DIAGNOSIS — K909 Intestinal malabsorption, unspecified: Secondary | ICD-10-CM

## 2023-04-04 MED ORDER — SODIUM CHLORIDE 0.9 % IV SOLN
300.0000 mg | Freq: Once | INTRAVENOUS | Status: AC
  Administered 2023-04-04: 300 mg via INTRAVENOUS
  Filled 2023-04-04: qty 300

## 2023-04-04 MED ORDER — SODIUM CHLORIDE 0.9 % IV SOLN: Freq: Once | INTRAVENOUS | Status: AC

## 2023-04-04 NOTE — Patient Instructions (Signed)
Iron Sucrose Injection What is this medication? IRON SUCROSE (EYE ern SOO krose) treats low levels of iron (iron deficiency anemia) in people with kidney disease. Iron is a mineral that plays an important role in making red blood cells, which carry oxygen from your lungs to the rest of your body. This medicine may be used for other purposes; ask your health care provider or pharmacist if you have questions. COMMON BRAND NAME(S): Venofer What should I tell my care team before I take this medication? They need to know if you have any of these conditions: Anemia not caused by low iron levels Heart disease High levels of iron in the blood Kidney disease Liver disease An unusual or allergic reaction to iron, other medications, foods, dyes, or preservatives Pregnant or trying to get pregnant Breastfeeding How should I use this medication? This medication is for infusion into a vein. It is given in a hospital or clinic setting. Talk to your care team about the use of this medication in children. While this medication may be prescribed for children as young as 2 years for selected conditions, precautions do apply. Overdosage: If you think you have taken too much of this medicine contact a poison control center or emergency room at once. NOTE: This medicine is only for you. Do not share this medicine with others. What if I miss a dose? Keep appointments for follow-up doses. It is important not to miss your dose. Call your care team if you are unable to keep an appointment. What may interact with this medication? Do not take this medication with any of the following: Deferoxamine Dimercaprol Other iron products This medication may also interact with the following: Chloramphenicol Deferasirox This list may not describe all possible interactions. Give your health care provider a list of all the medicines, herbs, non-prescription drugs, or dietary supplements you use. Also tell them if you smoke,  drink alcohol, or use illegal drugs. Some items may interact with your medicine. What should I watch for while using this medication? Visit your care team regularly. Tell your care team if your symptoms do not start to get better or if they get worse. You may need blood work done while you are taking this medication. You may need to follow a special diet. Talk to your care team. Foods that contain iron include: whole grains/cereals, dried fruits, beans, or peas, leafy green vegetables, and organ meats (liver, kidney). What side effects may I notice from receiving this medication? Side effects that you should report to your care team as soon as possible: Allergic reactions--skin rash, itching, hives, swelling of the face, lips, tongue, or throat Low blood pressure--dizziness, feeling faint or lightheaded, blurry vision Shortness of breath Side effects that usually do not require medical attention (report to your care team if they continue or are bothersome): Flushing Headache Joint pain Muscle pain Nausea Pain, redness, or irritation at injection site This list may not describe all possible side effects. Call your doctor for medical advice about side effects. You may report side effects to FDA at 1-800-FDA-1088. Where should I keep my medication? This medication is given in a hospital or clinic. It will not be stored at home. NOTE: This sheet is a summary. It may not cover all possible information. If you have questions about this medicine, talk to your doctor, pharmacist, or health care provider.  2024 Elsevier/Gold Standard (2022-11-30 00:00:00)

## 2023-04-04 NOTE — Progress Notes (Signed)
Patient does not want to stay for the 30 minute post IV iron recommended observation. Patient discharged ambulatory without complaints or concerns.

## 2023-04-05 ENCOUNTER — Ambulatory Visit (AMBULATORY_SURGERY_CENTER): Payer: Medicare Other

## 2023-04-05 ENCOUNTER — Encounter: Payer: Self-pay | Admitting: Internal Medicine

## 2023-04-05 VITALS — Ht 70.0 in | Wt 195.0 lb

## 2023-04-05 DIAGNOSIS — K317 Polyp of stomach and duodenum: Secondary | ICD-10-CM

## 2023-04-05 NOTE — Progress Notes (Signed)
Pre visit completed via phone call; Patient verified name, DOB, and address; No egg or soy allergy known to patient;  No issues known to pt with past sedation with any surgeries or procedures----other than severe PONV; Patient denies ever being told they had issues or difficulty with intubation ----other than severe PONV No FH of Malignant Hyperthermia; Pt is not on diet pills; Pt is not on home 02;  Pt is not on blood thinners; Pt denies issues with constipation  No A fib or A flutter;  Have any cardiac testing pending--NO Insurance verified during PV appt--- BCBS Medicare   Pt can ambulate without assistance;  Pt denies use of chewing tobacco; Discussed diabetic/weight loss medication holds; Discussed NSAID holds; Checked BMI to be less than 50; Pt instructed to use Singlecare.com or GoodRx for a price reduction on prep;  Patient's chart reviewed by Cathlyn Parsons CNRA prior to previsit and patient appropriate for the [facility];  Pre visit completed and red dot placed by patient's name on their procedure day (on provider's schedule);    Instructions sent to patient via MyChart per his request;

## 2023-04-11 ENCOUNTER — Ambulatory Visit: Payer: Medicare Other | Admitting: Internal Medicine

## 2023-04-11 ENCOUNTER — Encounter: Payer: Self-pay | Admitting: Internal Medicine

## 2023-04-11 VITALS — BP 131/77 | HR 60 | Temp 98.0°F | Resp 15 | Ht 70.0 in | Wt 195.0 lb

## 2023-04-11 DIAGNOSIS — K222 Esophageal obstruction: Secondary | ICD-10-CM

## 2023-04-11 DIAGNOSIS — K449 Diaphragmatic hernia without obstruction or gangrene: Secondary | ICD-10-CM

## 2023-04-11 DIAGNOSIS — K317 Polyp of stomach and duodenum: Secondary | ICD-10-CM | POA: Diagnosis present

## 2023-04-11 MED ORDER — SODIUM CHLORIDE 0.9 % IV SOLN: 500.0000 mL | Freq: Once | INTRAVENOUS | Status: DC

## 2023-04-11 NOTE — Progress Notes (Signed)
[facility] History and Physical   Primary Care Physician:  Mosetta Putt, MD   Reason for Procedure:   Recent UGI bleed, gastric polyps and hiatal hernia  Plan:    EGD     HPI: Nathaniel Grimes is a 72 y.o. male here for f/u after UGI bleed at [facility]. Has known hiatal hernia + Cameron erosions and large gastric polyps. 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 Authorizing Provider  allopurinol (ZYLOPRIM) 300 MG tablet Take 150 mg by mouth daily. 11/27/19  Yes [provider]  lisinopril-hydrochlorothiazide (ZESTORETIC) 10-12.5 MG tablet Take 1 tablet by mouth daily.   Yes [provider]  pantoprazole (PROTONIX) 40 MG tablet Take 40 mg by mouth 2 (two) times daily. 03/04/23  Yes [provider]  potassium chloride (KLOR-CON) 10 MEQ tablet Take 10 mEq by mouth 2 (two) times daily. 12/25/19  Yes [provider]  rosuvastatin (CRESTOR) 20 MG tablet Take 20 mg by mouth at bedtime. 12/22/19  Yes [provider]  tadalafil (CIALIS) 5 MG tablet Take 5 mg by mouth daily. 01/04/20   Yes [provider]  Acetaminophen (TYLENOL 8 HOUR PO) Take 1 tablet by mouth every 6 (six) hours as needed.    [provider]  ALPRAZolam Prudy Feeler) 0.5 MG tablet Take 0.5 mg by mouth daily as needed. 12/25/19   [provider]  ondansetron (ZOFRAN) 4 MG tablet Take 1 tablet (4 mg total) by mouth every 8 (eight) hours as needed for nausea or vomiting (every 6 hours as needed). May take very 6-8 hours as needed 02/25/20   Armbruster, Willaim Rayas, MD    Current Outpatient Medications  Medication Sig Dispense Refill   allopurinol (ZYLOPRIM) 300 MG tablet Take 150 mg by mouth daily.     lisinopril-hydrochlorothiazide (ZESTORETIC) 10-12.5 MG tablet Take 1 tablet by mouth daily.     pantoprazole (PROTONIX) 40 MG tablet Take 40 mg by mouth 2 (two) times daily.     potassium chloride (KLOR-CON) 10 MEQ tablet Take 10 mEq by mouth 2 (two) times daily.     rosuvastatin (CRESTOR) 20 MG tablet Take 20 mg by mouth at bedtime.     tadalafil (CIALIS) 5 MG tablet Take 5 mg by mouth daily.     Acetaminophen (TYLENOL 8 HOUR PO) Take 1 tablet by mouth every 6 (six) hours as needed.     ALPRAZolam (XANAX) 0.5 MG tablet Take 0.5 mg by mouth daily as needed.  ondansetron (ZOFRAN) 4 MG tablet Take 1 tablet (4 mg total) by mouth every 8 (eight) hours as needed for nausea or vomiting (every 6 hours as needed). May take very 6-8 hours as needed 10 tablet 0   Current Facility-Administered Medications  Medication Dose Route Frequency Provider Last Rate Last Admin   0.9 %  sodium chloride infusion  500 mL Intravenous Once Iva Boop, MD        Allergies as of 04/11/2023 - Review Complete 04/11/2023  Allergen Reaction Noted   Other Nausea And Vomiting 03/21/2023    Family History  Problem Relation Age of Onset   Breast cancer Mother    Kidney Stones Father        chronic   Breast cancer Sister    Diabetes Brother    Hypertension Brother    Sleep apnea Brother    Kidney Stones  Brother    Sleep apnea Brother    Kidney Stones Brother    Hiatal hernia Maternal Grandfather    Heart attack Maternal Grandfather    Heart attack Paternal Grandfather    Colon cancer Neg Hx    Esophageal cancer Neg Hx    Rectal cancer Neg Hx    Stomach cancer Neg Hx    Colon polyps Neg Hx     Social History   Socioeconomic History   Marital status: Married    Spouse name: Not on file   Number of children: 4   Years of education: Not on file   Highest education level: Not on file  Occupational History   Occupation: Airline pilot  Tobacco Use   Smoking status: Never   Smokeless tobacco: Never  Vaping Use   Vaping status: Never Used  Substance and Sexual Activity   Alcohol use: Yes    Comment: < 1 per day   Drug use: Never   Sexual activity: Not on file  Other Topics Concern   Not on file  Social History Narrative   Married to [place]   Lives in [place], is in [place] frequently for work, he is employed in Film/video editor.   4 daughters 10 grandchildren   Occasional alcohol never smoker 2 caffeinated beverages a day no other tobacco   Social Determinants of Corporate investment banker Strain: Not on file  Food Insecurity: No Food Insecurity (09/02/2020)   Received from [facility], [facility]   Hunger Vital Sign    Worried About Running Out of Food in the Last Year: Never true    Ran Out of Food in the Last Year: Never true  Transportation Needs: Not on file  Physical Activity: Not on file  Stress: Not on file  Social Connections: Unknown (11/21/2021)   Received from [facility], [facility]   Social Network    Social Network: Not on file  Intimate Partner Violence: Unknown (10/13/2021)   Received from [facility], [facility]   HITS    Physically Hurt: Not on file    Insult or Talk Down To: Not on file    Threaten Physical Harm: Not on file    Scream or Curse: Not on file    Review of Systems:  All other review of systems negative except as  mentioned in the HPI.  Physical Exam: Vital signs BP (!) 168/95   Pulse 60   Temp 98 F (36.7 C)   Resp 13   Ht 5\' 10"  (1.778 m)   Wt 195 lb (88.5 kg)  SpO2 100%   BMI 27.98 kg/m   General:   Alert,  Well-developed, well-nourished, pleasant and cooperative in NAD Lungs:  Clear throughout to auscultation.   Heart:  Regular rate and rhythm; no murmurs, clicks, rubs,  or gallops. Abdomen:  Soft, nontender and nondistended. Normal bowel sounds.   Neuro/Psych:  Alert and cooperative. Normal mood and affect. A and O x 3   @Hafiz Irion  Sena Slate, MD, [facility] [phone] (pager) 04/11/2023 2:22 PM@

## 2023-04-11 NOTE — Patient Instructions (Addendum)
Please read handouts provided. Continue present medications. Tylenol ( acetaminophen )  is okay, no aspirin or NSAIDs. Resume previous diet. Await pathology results.  YOU HAD AN ENDOSCOPIC PROCEDURE TODAY AT THE [facility]:   Refer to the procedure report that was given to you for any specific questions about what was found during the examination.  If the procedure report does not answer your questions, please call your gastroenterologist to clarify.  If you requested that your care partner not be given the details of your procedure findings, then the procedure report has been included in a sealed envelope for you to review at your convenience later.  YOU SHOULD EXPECT: Some feelings of bloating in the abdomen. Passage of more gas than usual.  Walking can help get rid of the air that was put into your GI tract during the procedure and reduce the bloating. If you had a lower endoscopy (such as a colonoscopy or flexible sigmoidoscopy) you may notice spotting of blood in your stool or on the toilet paper. If you underwent a bowel prep for your procedure, you may not have a normal bowel movement for a few days.  Please Note:  You might notice some irritation and congestion in your nose or some drainage.  This is from the oxygen used during your procedure.  There is no need for concern and it should clear up in a day or so.  SYMPTOMS TO REPORT IMMEDIATELY:  Following upper endoscopy (EGD)  Vomiting of blood or coffee ground material  New chest pain or pain under the shoulder blades  Painful or persistently difficult swallowing  New shortness of breath  Fever of 100F or higher  Black, tarry-looking stools  For urgent or emergent issues, a gastroenterologist can be reached at any hour by calling [phone]. Do not use MyChart messaging for urgent concerns.    DIET:  We do recommend a small meal at first, but then you may proceed to your regular diet.  Drink plenty of fluids but  you should avoid alcoholic beverages for 24 hours.  ACTIVITY:  You should plan to take it easy for the rest of today and you should NOT DRIVE or use heavy machinery until tomorrow (because of the sedation medicines used during the test).    FOLLOW UP: Our staff will call the number listed on your records the next business day following your procedure.  We will call around 7:15- 8:00 am to check on you and address any questions or concerns that you may have regarding the information given to you following your procedure. If we do not reach you, we will leave a message.     If any biopsies were taken you will be contacted by phone or by letter within the next 1-3 weeks.  Please call us at [phone] if you have not heard about the biopsies in 3 weeks.    SIGNATURES/CONFIDENTIALITY: You and/or your care partner have signed paperwork which will be entered into your electronic medical record.  These signatures attest to the fact that that the information above on your After Visit Summary has been reviewed and is understood.  Full responsibility of the confidentiality of this discharge information lies with you and/or your care-partner.I removed 6 polyps that all look benign.  I placed temporary clamps or clips at the base to reduce the risk of bleeding.  You have a 10 cm hiatal hernia I think it is the same size as before.  I did not see  any ulcers associated with that.  I am beginning to wonder if the vomiting you had did not cause those.  It probably contributed some.  I dilated the esophagus and used forceps to open it up some to so hopefully you will swallow better.  Avoid ibuprofen and similar drugs acetaminophen is safe.  Stay on your current dose of Protonix.  I will send in a prescription for ondansetron also known as Zofran in case you get nausea and vomiting again.  Keep that with you when you travel.  I will call you when the results are in and we can review future plans including  possible hiatal hernia repair.  I appreciate the opportunity to care for you. Iva Boop, MD, [facility]

## 2023-04-11 NOTE — Progress Notes (Signed)
Called to room to assist during endoscopic procedure.  Patient ID and intended procedure confirmed with present staff. Received instructions for my participation in the procedure from the performing physician.  

## 2023-04-11 NOTE — Progress Notes (Signed)
To pacu, VSS. Report to [email or web address] 

## 2023-04-11 NOTE — Op Note (Signed)
[facility] Patient Name: Nathaniel Grimes Procedure Date: 04/11/2023 2:02 PM MRN: [id number] Endoscopist: Iva Boop , MD, [id number] Age: 72 Referring MD:  Date of Birth: Nov 28, 1950 Gender: Male Account #: [id number] Procedure:                Upper GI endoscopy Indications:              Melena, Gastric polyps, Follow-up of gastric                            polyps, For therapy of gastric polyps Medicines:                Monitored Anesthesia Care Procedure:                Pre-Anesthesia Assessment:                           - Prior to the procedure, a History and Physical                            was performed, and patient medications and                            allergies were reviewed. The patient's tolerance of                            previous anesthesia was also reviewed. The risks                            and benefits of the procedure and the sedation                            options and risks were discussed with the patient.                            All questions were answered, and informed consent                            was obtained. Prior Anticoagulants: The patient has                            taken no anticoagulant or antiplatelet agents. ASA                            Grade Assessment: II - A patient with mild systemic                            disease. After reviewing the risks and benefits,                            the patient was deemed in satisfactory condition to                            undergo the procedure.  After obtaining informed consent, the endoscope was                            passed under direct vision. Throughout the                            procedure, the patient's blood pressure, pulse, and                            oxygen saturations were monitored continuously. The                            GIF [id number] #[id number] was introduced through the                            mouth, and advanced to  the second part of duodenum.                            The upper GI endoscopy was accomplished without                            difficulty. The patient tolerated the procedure                            well. Scope In: Scope Out: Findings:                 One benign-appearing, intrinsic moderate                            (circumferential scarring or stenosis; an endoscope                            may pass) stenosis was found at the                            gastroesophageal junction. The stenosis was                            traversed. A TTS dilator was passed through the                            scope. Dilation with an 18-19-20 mm balloon dilator                            was performed to 20 mm. The dilation site was                            examined and showed no change. Biopsy forceps used                            to disrupt the stricture also.                           A 10 cm hiatal  hernia was present.                           The gastroesophageal flap valve was visualized                            endoscopically and classified as Hill Grade IV (no                            fold, wide open lumen, hiatal hernia present).                           Multiple 3 to 25 mm semi-sessile polyps with no                            bleeding and no stigmata of recent bleeding were                            found in the cardia, in the gastric fundus and in                            the gastric body. These polyps were removed with a                            hot snare. Resection and retrieval were complete.                            Verification of patient identification for the                            specimen was done. Estimated blood loss: none. To                            prevent bleeding after the polypectomy, five                            hemostatic clips were successfully placed. There                            was no bleeding during, or at the end, of the                             procedure.                           The exam was otherwise without abnormality.                           The cardia and gastric fundus were normal on                            retroflexion. Complications:            No immediate complications. Estimated Blood Loss:     Estimated blood loss  was minimal. Impression:               - Benign-appearing esophageal stenosis. Dilated. No                            effect at 20 mm so I disrupted with forceps.                           - 10 cm hiatal hernia.                           - Gastroesophageal flap valve classified as Hill                            Grade IV (no fold, wide open lumen, hiatal hernia                            present).                           - Multiple gastric polyps. Resected and retrieved.                            Clips were placed. 6 Polyps removed randing 15-25                            mm all clipped. Two polyps were removed with one                            snare as they were adjecent. Numerous other polyps                            remain. TNTC                           - The examination was otherwise normal. Recommendation:           - Patient has a contact number available for                            emergencies. The signs and symptoms of potential                            delayed complications were discussed with the                            patient. Return to normal activities tomorrow.                            Written discharge instructions were provided to the                            patient.                           - Resume previous diet.                           -  Continue present medications. Acetaminophen ok -                            no aspirin or NSAIDs                           - Await pathology results.                           - Will call about pathology and possible hiatal                            hernia repair. I did not see Velna Hatchet as                             were seen in [place]. I do wonder if he had                            more of a Mallory-Weiss type syndrome as he                            presented after vomiting and also there was some                            NSAID use. He has had iron deficiency anemia and                            Cameron erosions may come and go so probable. Iva Boop, MD 04/11/2023 3:19:22 PM This report has been signed electronically.

## 2023-04-11 NOTE — Progress Notes (Signed)
Pt's states no medical or surgical changes since previsit or office visit. 

## 2023-04-12 ENCOUNTER — Telehealth: Payer: Self-pay

## 2023-04-12 NOTE — Telephone Encounter (Signed)
  Follow up Call-     04/11/2023    1:27 PM 01/26/2022   12:40 PM  Call back number  Post procedure Call Back phone  # [phone]  Permission to leave phone message Yes Yes     Patient questions:  Do you have a fever, pain , or abdominal swelling? No. Pain Score  0 *  Have you tolerated food without any problems? Yes.    Have you been able to return to your normal activities? Yes.    Do you have any questions about your discharge instructions: Diet   No. Medications  No. Follow up visit  No.  Do you have questions or concerns about your Care? No.  Actions: * If pain score is 4 or above: No action needed, pain <4.

## 2023-04-16 LAB — SURGICAL PATHOLOGY

## 2023-04-17 ENCOUNTER — Telehealth: Payer: Self-pay | Admitting: Internal Medicine

## 2023-04-17 NOTE — Telephone Encounter (Signed)
I have faxed the path and note to Dr Arnoldo Morale, fax # [phone]. I have placed the EGD recall for 1 year from now. I will work on the referral tomorrow.

## 2023-04-17 NOTE — Telephone Encounter (Signed)
Gastric polyps benign fundic gland polyps  Called and explained  He is well on bid pantoprazole and will continue  Regarding hiatal hernia - will refer to [name]    1) Place referral to Rhona Raider. Mikel Cella, MD for large hiatal hernia   2) EGD recall me 1 year  3) send this note and polyp path to Dr. Duaine Dredge

## 2023-04-18 NOTE — Telephone Encounter (Signed)
Referral faxed to [name] fax # [phone] to make appointment with Dr Rhona Raider. [name] for eval and treat for large hiatal hernia.

## 2023-05-01 ENCOUNTER — Other Ambulatory Visit: Payer: Self-pay

## 2023-05-01 DIAGNOSIS — K909 Intestinal malabsorption, unspecified: Secondary | ICD-10-CM

## 2023-05-02 ENCOUNTER — Inpatient Hospital Stay (HOSPITAL_BASED_OUTPATIENT_CLINIC_OR_DEPARTMENT_OTHER): Payer: Medicare Other | Admitting: Medical Oncology

## 2023-05-02 ENCOUNTER — Inpatient Hospital Stay: Payer: Medicare Other | Attending: Medical Oncology

## 2023-05-02 ENCOUNTER — Other Ambulatory Visit: Payer: Self-pay | Admitting: Medical Oncology

## 2023-05-02 ENCOUNTER — Encounter: Payer: Self-pay | Admitting: Medical Oncology

## 2023-05-02 VITALS — BP 146/66 | HR 58 | Temp 97.9°F | Resp 18 | Wt 197.1 lb

## 2023-05-02 DIAGNOSIS — Z884 Allergy status to anesthetic agent status: Secondary | ICD-10-CM | POA: Insufficient documentation

## 2023-05-02 DIAGNOSIS — Z79899 Other long term (current) drug therapy: Secondary | ICD-10-CM | POA: Diagnosis not present

## 2023-05-02 DIAGNOSIS — D500 Iron deficiency anemia secondary to blood loss (chronic): Secondary | ICD-10-CM

## 2023-05-02 DIAGNOSIS — K909 Intestinal malabsorption, unspecified: Secondary | ICD-10-CM | POA: Diagnosis not present

## 2023-05-02 LAB — CMP (CANCER CENTER ONLY)
ALT: 9 U/L (ref 0–44)
AST: 15 U/L (ref 15–41)
Albumin: 4.8 g/dL (ref 3.5–5.0)
Alkaline Phosphatase: 51 U/L (ref 38–126)
Anion gap: 7 (ref 5–15)
BUN: 19 mg/dL (ref 8–23)
CO2: 31 mmol/L (ref 22–32)
Calcium: 9.5 mg/dL (ref 8.9–10.3)
Chloride: 101 mmol/L (ref 98–111)
Creatinine: 1.42 mg/dL — ABNORMAL HIGH (ref 0.61–1.24)
GFR, Estimated: 53 mL/min — ABNORMAL LOW (ref >60)
Glucose, Bld: 96 mg/dL (ref 70–99)
Potassium: 3.6 mmol/L (ref 3.5–5.1)
Sodium: 139 mmol/L (ref 135–145)
Total Bilirubin: 0.5 mg/dL (ref 0.3–1.2)
Total Protein: 6.7 g/dL (ref 6.5–8.1)

## 2023-05-02 LAB — CBC WITH DIFFERENTIAL (CANCER CENTER ONLY)
Abs Immature Granulocytes: 0.08 10*3/uL — ABNORMAL HIGH (ref 0.00–0.07)
Basophils Absolute: 0 10*3/uL (ref 0.0–0.1)
Basophils Relative: 0 %
Eosinophils Absolute: 0.1 10*3/uL (ref 0.0–0.5)
Eosinophils Relative: 2 %
HCT: 42.5 % (ref 39.0–52.0)
Hemoglobin: 13.4 g/dL (ref 13.0–17.0)
Immature Granulocytes: 1 %
Lymphocytes Relative: 25 %
Lymphs Abs: 1.5 10*3/uL (ref 0.7–4.0)
MCH: 27.3 pg (ref 26.0–34.0)
MCHC: 31.5 g/dL (ref 30.0–36.0)
MCV: 86.6 fL (ref 80.0–100.0)
Monocytes Absolute: 0.6 10*3/uL (ref 0.1–1.0)
Monocytes Relative: 10 %
Neutro Abs: 3.7 10*3/uL (ref 1.7–7.7)
Neutrophils Relative %: 62 %
Platelet Count: 165 10*3/uL (ref 150–400)
RBC: 4.91 MIL/uL (ref 4.22–5.81)
RDW: 15.3 % (ref 11.5–15.5)
WBC Count: 6 10*3/uL (ref 4.0–10.5)
nRBC: 0 % (ref 0.0–0.2)

## 2023-05-02 LAB — FERRITIN: Ferritin: 36 ng/mL (ref 24–336)

## 2023-05-02 LAB — RETICULOCYTES
Immature Retic Fract: 11.4 % (ref 2.3–15.9)
RBC.: 4.93 MIL/uL (ref 4.22–5.81)
Retic Count, Absolute: 39.9 10*3/uL (ref 19.0–186.0)
Retic Ct Pct: 0.8 % (ref 0.4–3.1)

## 2023-05-02 LAB — IRON AND IRON BINDING CAPACITY (CC-WL,HP ONLY)
Iron: 30 ug/dL — ABNORMAL LOW (ref 45–182)
Saturation Ratios: 6 % — ABNORMAL LOW (ref 17.9–39.5)
TIBC: 514 ug/dL — ABNORMAL HIGH (ref 250–450)
UIBC: 484 ug/dL

## 2023-05-02 NOTE — Progress Notes (Signed)
Hematology and Oncology Follow Up Visit  Nathaniel Grimes [id number] [birth year] 72 y.o. 05/02/2023   Principle Diagnosis:  Iron deficiency anemia-GI blood loss  Current Therapy:   Oral iron/vitamin C     Interim History:  Mr. Nathaniel Grimes is back for follow-up.    Since his last visit he has been doing so well. He is back to his baseline with exercise. All his symptoms have resolved.   There has been no bleeding to her knowledge: denies epistaxis, gingivitis, hemoptysis, hematemesis, hematuria, melena, excessive bruising, blood donation.   He has had no fever.  He has had no cough or shortness of breath.  He has had no obvious melena or bright red blood per rectum.  Overall, I would say his performance status is probably ECOG 0. Wt Readings from Last 3 Encounters:  05/02/23 197 lb 1.3 oz (89.4 kg)  04/11/23 195 lb (88.5 kg)  04/05/23 195 lb (88.5 kg)   Medications:  Current Outpatient Medications:    Acetaminophen (TYLENOL 8 HOUR PO), Take 1 tablet by mouth every 6 (six) hours as needed., Disp: , Rfl:    allopurinol (ZYLOPRIM) 300 MG tablet, Take 150 mg by mouth daily., Disp: , Rfl:    ALPRAZolam (XANAX) 0.5 MG tablet, Take 0.5 mg by mouth daily as needed., Disp: , Rfl:    lisinopril-hydrochlorothiazide (ZESTORETIC) 10-12.5 MG tablet, Take 1 tablet by mouth daily., Disp: , Rfl:    ondansetron (ZOFRAN) 4 MG tablet, Take 1 tablet (4 mg total) by mouth every 8 (eight) hours as needed for nausea or vomiting (every 6 hours as needed). May take very 6-8 hours as needed, Disp: 10 tablet, Rfl: 0   pantoprazole (PROTONIX) 40 MG tablet, Take 40 mg by mouth 2 (two) times daily., Disp: , Rfl:    potassium chloride (KLOR-CON) 10 MEQ tablet, Take 10 mEq by mouth 2 (two) times daily., Disp: , Rfl:    rosuvastatin (CRESTOR) 20 MG tablet, Take 20 mg by mouth at bedtime., Disp: , Rfl:    tadalafil (CIALIS) 5 MG tablet, Take 5 mg by mouth daily., Disp: , Rfl:    triamterene-hydrochlorothiazide  (MAXZIDE-25) 37.5-25 MG tablet, Take 1 tablet by mouth daily., Disp: , Rfl:   Allergies:  Allergies  Allergen Reactions   Other Nausea And Vomiting    Anesthesia used for a brow lift. Severe nausea and vomiting in the recovery room.    Past Medical History, Surgical history, Social history, and Family History were reviewed and updated.  Review of Systems: Review of Systems  Constitutional: Negative.   HENT:  Negative.    Eyes: Negative.   Respiratory: Negative.    Cardiovascular: Negative.   Gastrointestinal: Negative.   Endocrine: Negative.   Genitourinary: Negative.    Musculoskeletal: Negative.   Skin: Negative.   Neurological: Negative.   Hematological: Negative.   Psychiatric/Behavioral: Negative.      Physical Exam:  weight is 197 lb 1.3 oz (89.4 kg). His oral temperature is 97.9 F (36.6 C). His blood pressure is 146/66 (abnormal) and his pulse is 58 (abnormal). His respiration is 18 and oxygen saturation is 100%.   Wt Readings from Last 3 Encounters:  05/02/23 197 lb 1.3 oz (89.4 kg)  04/11/23 195 lb (88.5 kg)  04/05/23 195 lb (88.5 kg)    Physical Exam Vitals reviewed.  HENT:     Head: Normocephalic and atraumatic.  Eyes:     Pupils: Pupils are equal, round, and reactive to light.  Cardiovascular:  Rate and Rhythm: Normal rate and regular rhythm.     Heart sounds: Normal heart sounds.  Pulmonary:     Effort: Pulmonary effort is normal.     Breath sounds: Normal breath sounds.  Abdominal:     General: Bowel sounds are normal.     Palpations: Abdomen is soft.  Musculoskeletal:        General: No tenderness or deformity. Normal range of motion.     Cervical back: Normal range of motion.  Lymphadenopathy:     Cervical: No cervical adenopathy.  Skin:    General: Skin is warm and dry.     Findings: No erythema or rash.  Neurological:     Mental Status: He is alert and oriented to person, place, and time.  Psychiatric:        Behavior: Behavior  normal.        Thought Content: Thought content normal.        Judgment: Judgment normal.      Lab Results  Component Value Date   WBC 6.0 05/02/2023   HGB 13.4 05/02/2023   HCT 42.5 05/02/2023   MCV 86.6 05/02/2023   PLT 165 05/02/2023     Chemistry      Component Value Date/Time   NA 139 05/02/2023 0922   K 3.6 05/02/2023 0922   CL 101 05/02/2023 0922   CO2 31 05/02/2023 0922   BUN 19 05/02/2023 0922   CREATININE 1.42 (H) 05/02/2023 0922      Component Value Date/Time   CALCIUM 9.5 05/02/2023 0922   ALKPHOS 51 05/02/2023 0922   AST 15 05/02/2023 0922   ALT 9 05/02/2023 0922   BILITOT 0.5 05/02/2023 1610     Encounter Diagnoses  Name Primary?   Iron deficiency anemia due to chronic blood loss Yes   Iron malabsorption      Impression and Plan: Nathaniel Grimes is a very nice 72 year old white male.  S/p IV Iron treatments for a suspected GI bleed that has appeared to have resolved.   CBC reviewed today and greatly improved. He is back to baseline in terms of symptoms.   Disposition RTC 6 months APP, labs ( CBC, iron, ferritin)-[place]   Rushie Chestnut, PA-C 10/24/202410:10 AM

## 2023-05-07 ENCOUNTER — Inpatient Hospital Stay: Payer: Medicare Other

## 2023-05-07 VITALS — BP 140/64 | HR 62 | Temp 97.9°F | Resp 18

## 2023-05-07 DIAGNOSIS — K909 Intestinal malabsorption, unspecified: Secondary | ICD-10-CM

## 2023-05-07 DIAGNOSIS — D500 Iron deficiency anemia secondary to blood loss (chronic): Secondary | ICD-10-CM | POA: Diagnosis not present

## 2023-05-07 MED ORDER — SODIUM CHLORIDE 0.9 % IV SOLN
300.0000 mg | Freq: Once | INTRAVENOUS | Status: AC
  Administered 2023-05-07: 300 mg via INTRAVENOUS
  Filled 2023-05-07: qty 300

## 2023-05-07 MED ORDER — SODIUM CHLORIDE 0.9 % IV SOLN: Freq: Once | INTRAVENOUS | Status: AC

## 2023-05-07 NOTE — Progress Notes (Signed)
Patient refused to wait 30 minutes post infusion. Released stable and ASX. 

## 2023-05-07 NOTE — Patient Instructions (Signed)
Iron Sucrose Injection What is this medication? IRON SUCROSE (EYE ern SOO krose) treats low levels of iron (iron deficiency anemia) in people with kidney disease. Iron is a mineral that plays an important role in making red blood cells, which carry oxygen from your lungs to the rest of your body. This medicine may be used for other purposes; ask your health care provider or pharmacist if you have questions. COMMON BRAND NAME(S): Venofer What should I tell my care team before I take this medication? They need to know if you have any of these conditions: Anemia not caused by low iron levels Heart disease High levels of iron in the blood Kidney disease Liver disease An unusual or allergic reaction to iron, other medications, foods, dyes, or preservatives Pregnant or trying to get pregnant Breastfeeding How should I use this medication? This medication is for infusion into a vein. It is given in a hospital or clinic setting. Talk to your care team about the use of this medication in children. While this medication may be prescribed for children as young as 2 years for selected conditions, precautions do apply. Overdosage: If you think you have taken too much of this medicine contact a poison control center or emergency room at once. NOTE: This medicine is only for you. Do not share this medicine with others. What if I miss a dose? Keep appointments for follow-up doses. It is important not to miss your dose. Call your care team if you are unable to keep an appointment. What may interact with this medication? Do not take this medication with any of the following: Deferoxamine Dimercaprol Other iron products This medication may also interact with the following: Chloramphenicol Deferasirox This list may not describe all possible interactions. Give your health care provider a list of all the medicines, herbs, non-prescription drugs, or dietary supplements you use. Also tell them if you smoke,  drink alcohol, or use illegal drugs. Some items may interact with your medicine. What should I watch for while using this medication? Visit your care team regularly. Tell your care team if your symptoms do not start to get better or if they get worse. You may need blood work done while you are taking this medication. You may need to follow a special diet. Talk to your care team. Foods that contain iron include: whole grains/cereals, dried fruits, beans, or peas, leafy green vegetables, and organ meats (liver, kidney). What side effects may I notice from receiving this medication? Side effects that you should report to your care team as soon as possible: Allergic reactions--skin rash, itching, hives, swelling of the face, lips, tongue, or throat Low blood pressure--dizziness, feeling faint or lightheaded, blurry vision Shortness of breath Side effects that usually do not require medical attention (report to your care team if they continue or are bothersome): Flushing Headache Joint pain Muscle pain Nausea Pain, redness, or irritation at injection site This list may not describe all possible side effects. Call your doctor for medical advice about side effects. You may report side effects to FDA at 1-800-FDA-1088. Where should I keep my medication? This medication is given in a hospital or clinic. It will not be stored at home. NOTE: This sheet is a summary. It may not cover all possible information. If you have questions about this medicine, talk to your doctor, pharmacist, or health care provider.  2024 Elsevier/Gold Standard (2022-11-30 00:00:00)

## 2023-05-09 NOTE — Telephone Encounter (Signed)
Patient called regarding his referral to [facility] and stated that he hasn't heard from [name] yet.

## 2023-05-09 NOTE — Telephone Encounter (Signed)
I spoke to [facility] and they couldn't find the referral that was faxed to them on 04/18/2023. They gave me another fax # of # [phone]. Phone # [phone]. I called and told Mr Mabile that it has been re-faxed and the lady said they try to get an answer in 3 days. They reach out to him to set up the appointment. He wrote down the phone # to call and check on the status of the referral.

## 2023-05-14 ENCOUNTER — Ambulatory Visit: Payer: Medicare Other

## 2023-05-17 NOTE — Telephone Encounter (Signed)
I called [name] again and Colin Mulders said she still can't see the referral notes. She requested I refax the notes to her attention to the same fax # as I had faxed it to last time. I got confirmation it went thru.

## 2023-05-21 ENCOUNTER — Inpatient Hospital Stay: Payer: Medicare Other | Attending: Medical Oncology

## 2023-05-21 VITALS — BP 127/69 | HR 60 | Temp 97.5°F | Resp 18

## 2023-05-21 DIAGNOSIS — D500 Iron deficiency anemia secondary to blood loss (chronic): Secondary | ICD-10-CM | POA: Diagnosis present

## 2023-05-21 DIAGNOSIS — Z79899 Other long term (current) drug therapy: Secondary | ICD-10-CM | POA: Diagnosis not present

## 2023-05-21 DIAGNOSIS — K909 Intestinal malabsorption, unspecified: Secondary | ICD-10-CM

## 2023-05-21 DIAGNOSIS — K922 Gastrointestinal hemorrhage, unspecified: Secondary | ICD-10-CM | POA: Insufficient documentation

## 2023-05-21 MED ORDER — SODIUM CHLORIDE 0.9 % IV SOLN: Freq: Once | INTRAVENOUS | Status: AC

## 2023-05-21 MED ORDER — SODIUM CHLORIDE 0.9 % IV SOLN
300.0000 mg | Freq: Once | INTRAVENOUS | Status: AC
  Administered 2023-05-21: 300 mg via INTRAVENOUS
  Filled 2023-05-21: qty 300

## 2023-05-21 NOTE — Patient Instructions (Signed)
Iron Sucrose Injection What is this medication? IRON SUCROSE (EYE ern SOO krose) treats low levels of iron (iron deficiency anemia) in people with kidney disease. Iron is a mineral that plays an important role in making red blood cells, which carry oxygen from your lungs to the rest of your body. This medicine may be used for other purposes; ask your health care provider or pharmacist if you have questions. COMMON BRAND NAME(S): Venofer What should I tell my care team before I take this medication? They need to know if you have any of these conditions: Anemia not caused by low iron levels Heart disease High levels of iron in the blood Kidney disease Liver disease An unusual or allergic reaction to iron, other medications, foods, dyes, or preservatives Pregnant or trying to get pregnant Breastfeeding How should I use this medication? This medication is for infusion into a vein. It is given in a hospital or clinic setting. Talk to your care team about the use of this medication in children. While this medication may be prescribed for children as young as 2 years for selected conditions, precautions do apply. Overdosage: If you think you have taken too much of this medicine contact a poison control center or emergency room at once. NOTE: This medicine is only for you. Do not share this medicine with others. What if I miss a dose? Keep appointments for follow-up doses. It is important not to miss your dose. Call your care team if you are unable to keep an appointment. What may interact with this medication? Do not take this medication with any of the following: Deferoxamine Dimercaprol Other iron products This medication may also interact with the following: Chloramphenicol Deferasirox This list may not describe all possible interactions. Give your health care provider a list of all the medicines, herbs, non-prescription drugs, or dietary supplements you use. Also tell them if you smoke,  drink alcohol, or use illegal drugs. Some items may interact with your medicine. What should I watch for while using this medication? Visit your care team regularly. Tell your care team if your symptoms do not start to get better or if they get worse. You may need blood work done while you are taking this medication. You may need to follow a special diet. Talk to your care team. Foods that contain iron include: whole grains/cereals, dried fruits, beans, or peas, leafy green vegetables, and organ meats (liver, kidney). What side effects may I notice from receiving this medication? Side effects that you should report to your care team as soon as possible: Allergic reactions--skin rash, itching, hives, swelling of the face, lips, tongue, or throat Low blood pressure--dizziness, feeling faint or lightheaded, blurry vision Shortness of breath Side effects that usually do not require medical attention (report to your care team if they continue or are bothersome): Flushing Headache Joint pain Muscle pain Nausea Pain, redness, or irritation at injection site This list may not describe all possible side effects. Call your doctor for medical advice about side effects. You may report side effects to FDA at 1-800-FDA-1088. Where should I keep my medication? This medication is given in a hospital or clinic. It will not be stored at home. NOTE: This sheet is a summary. It may not cover all possible information. If you have questions about this medicine, talk to your doctor, pharmacist, or health care provider.  2024 Elsevier/Gold Standard (2022-11-30 00:00:00)

## 2023-05-21 NOTE — Progress Notes (Signed)
Pt refused to stay for 30 minute post iron observation.  Pt without complaints at time of discharge.  

## 2023-05-23 NOTE — Telephone Encounter (Signed)
Nathaniel Grimes did report today he is feeling "great".

## 2023-05-23 NOTE — Telephone Encounter (Signed)
Nathaniel Grimes said he called [name] and they transferred him and he never got to speak to anyone. He has not heard anything about an appointment. I will try to call [name] later on today as I am in clinic today with providers.

## 2023-05-23 NOTE — Telephone Encounter (Signed)
Inbound call from patient requesting an update regarding referral. Patient is requesting a call back. Please advise, thank you.

## 2023-05-24 NOTE — Telephone Encounter (Signed)
I spoke with Rich today and [name] called him yesterday. He has an appointment set up for 08/16/2023.

## 2023-05-31 ENCOUNTER — Inpatient Hospital Stay: Payer: Medicare Other

## 2023-05-31 VITALS — BP 135/79 | HR 56 | Temp 98.2°F | Resp 17

## 2023-05-31 DIAGNOSIS — D500 Iron deficiency anemia secondary to blood loss (chronic): Secondary | ICD-10-CM | POA: Diagnosis not present

## 2023-05-31 DIAGNOSIS — K909 Intestinal malabsorption, unspecified: Secondary | ICD-10-CM

## 2023-05-31 MED ORDER — SODIUM CHLORIDE 0.9 % IV SOLN
300.0000 mg | Freq: Once | INTRAVENOUS | Status: AC
  Administered 2023-05-31: 300 mg via INTRAVENOUS
  Filled 2023-05-31: qty 300

## 2023-05-31 MED ORDER — SODIUM CHLORIDE 0.9 % IV SOLN
INTRAVENOUS | Status: DC
Start: 2023-05-31 — End: 2023-05-31

## 2023-05-31 NOTE — Patient Instructions (Signed)
Iron Sucrose Injection What is this medication? IRON SUCROSE (EYE ern SOO krose) treats low levels of iron (iron deficiency anemia) in people with kidney disease. Iron is a mineral that plays an important role in making red blood cells, which carry oxygen from your lungs to the rest of your body. This medicine may be used for other purposes; ask your health care provider or pharmacist if you have questions. COMMON BRAND NAME(S): Venofer What should I tell my care team before I take this medication? They need to know if you have any of these conditions: Anemia not caused by low iron levels Heart disease High levels of iron in the blood Kidney disease Liver disease An unusual or allergic reaction to iron, other medications, foods, dyes, or preservatives Pregnant or trying to get pregnant Breastfeeding How should I use this medication? This medication is for infusion into a vein. It is given in a hospital or clinic setting. Talk to your care team about the use of this medication in children. While this medication may be prescribed for children as young as 2 years for selected conditions, precautions do apply. Overdosage: If you think you have taken too much of this medicine contact a poison control center or emergency room at once. NOTE: This medicine is only for you. Do not share this medicine with others. What if I miss a dose? Keep appointments for follow-up doses. It is important not to miss your dose. Call your care team if you are unable to keep an appointment. What may interact with this medication? Do not take this medication with any of the following: Deferoxamine Dimercaprol Other iron products This medication may also interact with the following: Chloramphenicol Deferasirox This list may not describe all possible interactions. Give your health care provider a list of all the medicines, herbs, non-prescription drugs, or dietary supplements you use. Also tell them if you smoke,  drink alcohol, or use illegal drugs. Some items may interact with your medicine. What should I watch for while using this medication? Visit your care team regularly. Tell your care team if your symptoms do not start to get better or if they get worse. You may need blood work done while you are taking this medication. You may need to follow a special diet. Talk to your care team. Foods that contain iron include: whole grains/cereals, dried fruits, beans, or peas, leafy green vegetables, and organ meats (liver, kidney). What side effects may I notice from receiving this medication? Side effects that you should report to your care team as soon as possible: Allergic reactions--skin rash, itching, hives, swelling of the face, lips, tongue, or throat Low blood pressure--dizziness, feeling faint or lightheaded, blurry vision Shortness of breath Side effects that usually do not require medical attention (report to your care team if they continue or are bothersome): Flushing Headache Joint pain Muscle pain Nausea Pain, redness, or irritation at injection site This list may not describe all possible side effects. Call your doctor for medical advice about side effects. You may report side effects to FDA at 1-800-FDA-1088. Where should I keep my medication? This medication is given in a hospital or clinic. It will not be stored at home. NOTE: This sheet is a summary. It may not cover all possible information. If you have questions about this medicine, talk to your doctor, pharmacist, or health care provider.  2024 Elsevier/Gold Standard (2022-11-30 00:00:00)

## 2023-10-30 ENCOUNTER — Other Ambulatory Visit: Payer: Self-pay | Admitting: Medical Oncology

## 2023-10-30 DIAGNOSIS — D500 Iron deficiency anemia secondary to blood loss (chronic): Secondary | ICD-10-CM

## 2023-10-31 ENCOUNTER — Inpatient Hospital Stay: Payer: Medicare Other | Attending: Hematology & Oncology

## 2023-10-31 ENCOUNTER — Inpatient Hospital Stay (HOSPITAL_BASED_OUTPATIENT_CLINIC_OR_DEPARTMENT_OTHER): Payer: Medicare Other | Admitting: Medical Oncology

## 2023-10-31 ENCOUNTER — Other Ambulatory Visit: Payer: Self-pay | Admitting: Medical Oncology

## 2023-10-31 ENCOUNTER — Encounter: Payer: Self-pay | Admitting: Medical Oncology

## 2023-10-31 VITALS — BP 130/86 | HR 50 | Temp 97.9°F | Resp 18 | Wt 203.1 lb

## 2023-10-31 DIAGNOSIS — K909 Intestinal malabsorption, unspecified: Secondary | ICD-10-CM

## 2023-10-31 DIAGNOSIS — K922 Gastrointestinal hemorrhage, unspecified: Secondary | ICD-10-CM | POA: Insufficient documentation

## 2023-10-31 DIAGNOSIS — D500 Iron deficiency anemia secondary to blood loss (chronic): Secondary | ICD-10-CM | POA: Insufficient documentation

## 2023-10-31 DIAGNOSIS — Z79899 Other long term (current) drug therapy: Secondary | ICD-10-CM | POA: Insufficient documentation

## 2023-10-31 DIAGNOSIS — Z884 Allergy status to anesthetic agent status: Secondary | ICD-10-CM | POA: Insufficient documentation

## 2023-10-31 DIAGNOSIS — D649 Anemia, unspecified: Secondary | ICD-10-CM

## 2023-10-31 DIAGNOSIS — N189 Chronic kidney disease, unspecified: Secondary | ICD-10-CM

## 2023-10-31 DIAGNOSIS — R7989 Other specified abnormal findings of blood chemistry: Secondary | ICD-10-CM | POA: Diagnosis not present

## 2023-10-31 LAB — CBC WITH DIFFERENTIAL (CANCER CENTER ONLY)
Abs Immature Granulocytes: 0.05 10*3/uL (ref 0.00–0.07)
Basophils Absolute: 0 10*3/uL (ref 0.0–0.1)
Basophils Relative: 0 %
Eosinophils Absolute: 0 10*3/uL (ref 0.0–0.5)
Eosinophils Relative: 1 %
HCT: 50.4 % (ref 39.0–52.0)
Hemoglobin: 16.7 g/dL (ref 13.0–17.0)
Immature Granulocytes: 1 %
Lymphocytes Relative: 25 %
Lymphs Abs: 1.5 10*3/uL (ref 0.7–4.0)
MCH: 29.6 pg (ref 26.0–34.0)
MCHC: 33.1 g/dL (ref 30.0–36.0)
MCV: 89.2 fL (ref 80.0–100.0)
Monocytes Absolute: 0.7 10*3/uL (ref 0.1–1.0)
Monocytes Relative: 11 %
Neutro Abs: 3.7 10*3/uL (ref 1.7–7.7)
Neutrophils Relative %: 62 %
Platelet Count: 170 10*3/uL (ref 150–400)
RBC: 5.65 MIL/uL (ref 4.22–5.81)
RDW: 13.9 % (ref 11.5–15.5)
WBC Count: 6 10*3/uL (ref 4.0–10.5)
nRBC: 0 % (ref 0.0–0.2)

## 2023-10-31 LAB — CMP (CANCER CENTER ONLY)
ALT: 18 U/L (ref 0–44)
AST: 37 U/L (ref 15–41)
Albumin: 4.9 g/dL (ref 3.5–5.0)
Alkaline Phosphatase: 52 U/L (ref 38–126)
Anion gap: 8 (ref 5–15)
BUN: 24 mg/dL — ABNORMAL HIGH (ref 8–23)
CO2: 31 mmol/L (ref 22–32)
Calcium: 10.2 mg/dL (ref 8.9–10.3)
Chloride: 99 mmol/L (ref 98–111)
Creatinine: 1.51 mg/dL — ABNORMAL HIGH (ref 0.61–1.24)
GFR, Estimated: 48 mL/min — ABNORMAL LOW (ref >60)
Glucose, Bld: 90 mg/dL (ref 70–99)
Potassium: 3.9 mmol/L (ref 3.5–5.1)
Sodium: 138 mmol/L (ref 135–145)
Total Bilirubin: 0.8 mg/dL (ref 0.0–1.2)
Total Protein: 7.4 g/dL (ref 6.5–8.1)

## 2023-10-31 LAB — RETIC PANEL
Immature Retic Fract: 17.6 % — ABNORMAL HIGH (ref 2.3–15.9)
RBC.: 5.57 MIL/uL (ref 4.22–5.81)
Retic Count, Absolute: 78.5 10*3/uL (ref 19.0–186.0)
Retic Ct Pct: 1.4 % (ref 0.4–3.1)
Reticulocyte Hemoglobin: 33.3 pg (ref >27.9)

## 2023-10-31 LAB — IRON AND IRON BINDING CAPACITY (CC-WL,HP ONLY)
Iron: 88 ug/dL (ref 45–182)
Saturation Ratios: 17 % — ABNORMAL LOW (ref 17.9–39.5)
TIBC: 531 ug/dL — ABNORMAL HIGH (ref 250–450)
UIBC: 443 ug/dL — ABNORMAL HIGH (ref 117–376)

## 2023-10-31 LAB — FERRITIN: Ferritin: 35 ng/mL (ref 24–336)

## 2023-10-31 NOTE — Progress Notes (Signed)
 Hematology and Oncology Follow Up Visit  Nathaniel Grimes [id number] 10-Sep-1950 73 y.o. 10/31/2023   Principle Diagnosis:  Iron  deficiency anemia-GI blood loss  Current Therapy:   Oral iron /vitamin C     Interim History:  Nathaniel Grimes is back for follow-up.    Today he reports that he has been well. He has been enjoying fishing a lot recently  There has been no bleeding to his knowledge: denies epistaxis, gingivitis, hemoptysis, hematemesis, hematuria, melena, excessive bruising, blood donation.   He has had no fever.  He has had no cough or shortness of breath.  He has had no obvious melena or bright red blood per rectum.  Overall, I would say his performance status is probably ECOG 0. Wt Readings from Last 3 Encounters:  10/31/23 203 lb 1.3 oz (92.1 kg)  05/02/23 197 lb 1.3 oz (89.4 kg)  04/11/23 195 lb (88.5 kg)   Medications:  Current Outpatient Medications:    Acetaminophen (TYLENOL 8 HOUR PO), Take 1 tablet by mouth every 6 (six) hours as needed., Disp: , Rfl:    allopurinol (ZYLOPRIM) 300 MG tablet, Take 150 mg by mouth daily., Disp: , Rfl:    ALPRAZolam (XANAX) 0.5 MG tablet, Take 0.5 mg by mouth daily as needed., Disp: , Rfl:    lisinopril-hydrochlorothiazide (ZESTORETIC) 10-12.5 MG tablet, Take 1 tablet by mouth daily., Disp: , Rfl:    ondansetron  (ZOFRAN ) 4 MG tablet, Take 1 tablet (4 mg total) by mouth every 8 (eight) hours as needed for nausea or vomiting (every 6 hours as needed). May take very 6-8 hours as needed, Disp: 10 tablet, Rfl: 0   pantoprazole (PROTONIX) 40 MG tablet, Take 40 mg by mouth 2 (two) times daily., Disp: , Rfl:    potassium chloride (KLOR-CON) 10 MEQ tablet, Take 10 mEq by mouth 2 (two) times daily., Disp: , Rfl:    rosuvastatin (CRESTOR) 20 MG tablet, Take 20 mg by mouth at bedtime., Disp: , Rfl:    tadalafil (CIALIS) 5 MG tablet, Take 5 mg by mouth daily., Disp: , Rfl:    triamterene-hydrochlorothiazide (MAXZIDE-25) 37.5-25 MG tablet, Take 1  tablet by mouth daily., Disp: , Rfl:   Allergies:  Allergies  Allergen Reactions   Other Nausea And Vomiting    Anesthesia used for a brow lift. Severe nausea and vomiting in the recovery room.    Past Medical History, Surgical history, Social history, and Family History were reviewed and updated.  Review of Systems: Review of Systems  Constitutional: Negative.   HENT:  Negative.    Eyes: Negative.   Respiratory: Negative.    Cardiovascular: Negative.   Gastrointestinal: Negative.   Endocrine: Negative.   Genitourinary: Negative.    Musculoskeletal: Negative.   Skin: Negative.   Neurological: Negative.   Hematological: Negative.   Psychiatric/Behavioral: Negative.      Physical Exam:  weight is 203 lb 1.3 oz (92.1 kg). His oral temperature is 97.9 F (36.6 C). His blood pressure is 130/86 and his pulse is 50 (abnormal). His respiration is 18 and oxygen saturation is 99%.   Wt Readings from Last 3 Encounters:  10/31/23 203 lb 1.3 oz (92.1 kg)  05/02/23 197 lb 1.3 oz (89.4 kg)  04/11/23 195 lb (88.5 kg)    Physical Exam Vitals reviewed.  HENT:     Head: Normocephalic and atraumatic.  Eyes:     Pupils: Pupils are equal, round, and reactive to light.  Cardiovascular:     Rate and Rhythm: Normal rate  and regular rhythm.     Heart sounds: Normal heart sounds.  Pulmonary:     Effort: Pulmonary effort is normal.     Breath sounds: Normal breath sounds.  Abdominal:     General: Bowel sounds are normal.     Palpations: Abdomen is soft.  Musculoskeletal:        General: No tenderness or deformity. Normal range of motion.     Cervical back: Normal range of motion.  Lymphadenopathy:     Cervical: No cervical adenopathy.  Skin:    General: Skin is warm and dry.     Findings: No erythema or rash.  Neurological:     Mental Status: He is alert and oriented to person, place, and time.  Psychiatric:        Behavior: Behavior normal.        Thought Content: Thought  content normal.        Judgment: Judgment normal.      Lab Results  Component Value Date   WBC 6.0 10/31/2023   HGB 16.7 10/31/2023   HCT 50.4 10/31/2023   MCV 89.2 10/31/2023   PLT 170 10/31/2023     Chemistry      Component Value Date/Time   NA 139 05/02/2023 0922   K 3.6 05/02/2023 0922   CL 101 05/02/2023 0922   CO2 31 05/02/2023 0922   BUN 19 05/02/2023 0922   CREATININE 1.42 (H) 05/02/2023 0922      Component Value Date/Time   CALCIUM 9.5 05/02/2023 0922   ALKPHOS 51 05/02/2023 0922   AST 15 05/02/2023 0922   ALT 9 05/02/2023 0922   BILITOT 0.5 05/02/2023 0922     No diagnosis found.  Impression and Plan: Nathaniel Grimes is a very nice 73 year old white male.  S/p IV Iron  treatments for a suspected GI bleed that has appeared to have resolved.   CBC looks good again today.  Given concurrent CKD will draw MM labs Iron  studies pending Continue follow up with GI  Disposition RTC 6 months APP, labs ( CBC, iron , ferritin, erythro, retic)-[place]   Sharla Davis, PA-C 4/24/202511:46 AM

## 2023-11-01 LAB — KAPPA/LAMBDA LIGHT CHAINS
Kappa free light chain: 17.6 mg/L (ref 3.3–19.4)
Kappa, lambda light chain ratio: 1.52 (ref 0.26–1.65)
Lambda free light chains: 11.6 mg/L (ref 5.7–26.3)

## 2023-11-04 ENCOUNTER — Other Ambulatory Visit: Payer: Self-pay | Admitting: Medical Oncology

## 2023-11-04 ENCOUNTER — Encounter: Payer: Self-pay | Admitting: Medical Oncology

## 2023-11-04 LAB — MULTIPLE MYELOMA PANEL, SERUM
Albumin SerPl Elph-Mcnc: 4.1 g/dL (ref 2.9–4.4)
Albumin/Glob SerPl: 1.4 (ref 0.7–1.7)
Alpha 1: 0.3 g/dL (ref 0.0–0.4)
Alpha2 Glob SerPl Elph-Mcnc: 0.8 g/dL (ref 0.4–1.0)
B-Globulin SerPl Elph-Mcnc: 1.1 g/dL (ref 0.7–1.3)
Gamma Glob SerPl Elph-Mcnc: 1 g/dL (ref 0.4–1.8)
Globulin, Total: 3.1 g/dL (ref 2.2–3.9)
IgA: 111 mg/dL (ref 61–437)
IgG (Immunoglobin G), Serum: 1024 mg/dL (ref 603–1613)
IgM (Immunoglobulin M), Srm: 36 mg/dL (ref 15–143)
Total Protein ELP: 7.2 g/dL (ref 6.0–8.5)

## 2023-11-26 ENCOUNTER — Other Ambulatory Visit: Payer: Self-pay | Admitting: Medical Oncology

## 2023-11-27 ENCOUNTER — Inpatient Hospital Stay: Attending: Hematology & Oncology

## 2023-11-27 VITALS — BP 125/63 | HR 55 | Temp 97.8°F | Resp 17

## 2023-11-27 DIAGNOSIS — K909 Intestinal malabsorption, unspecified: Secondary | ICD-10-CM

## 2023-11-27 DIAGNOSIS — D500 Iron deficiency anemia secondary to blood loss (chronic): Secondary | ICD-10-CM | POA: Diagnosis present

## 2023-11-27 DIAGNOSIS — Z79899 Other long term (current) drug therapy: Secondary | ICD-10-CM | POA: Diagnosis not present

## 2023-11-27 MED ORDER — IRON SUCROSE 20 MG/ML IV SOLN
300.0000 mg | Freq: Once | INTRAVENOUS | Status: AC
  Administered 2023-11-27: 300 mg via INTRAVENOUS
  Filled 2023-11-27: qty 300

## 2023-11-27 MED ORDER — SODIUM CHLORIDE 0.9 % IV SOLN: INTRAVENOUS | Status: DC

## 2023-11-27 NOTE — Patient Instructions (Signed)

## 2023-12-04 ENCOUNTER — Other Ambulatory Visit: Payer: Self-pay | Admitting: Medical Oncology

## 2023-12-04 ENCOUNTER — Inpatient Hospital Stay

## 2023-12-04 ENCOUNTER — Ambulatory Visit (INDEPENDENT_AMBULATORY_CARE_PROVIDER_SITE_OTHER)
Admission: RE | Admit: 2023-12-04 | Discharge: 2023-12-04 | Disposition: A | Discharge status: Home / Self Care | Source: Ambulatory Visit | Attending: Medical Oncology | Admitting: Medical Oncology

## 2023-12-04 VITALS — BP 131/74 | HR 64 | Temp 98.7°F | Resp 20

## 2023-12-04 DIAGNOSIS — D500 Iron deficiency anemia secondary to blood loss (chronic): Secondary | ICD-10-CM

## 2023-12-04 DIAGNOSIS — M79602 Pain in left arm: Secondary | ICD-10-CM

## 2023-12-04 DIAGNOSIS — K909 Intestinal malabsorption, unspecified: Secondary | ICD-10-CM

## 2023-12-04 MED ORDER — SODIUM CHLORIDE 0.9 % IV SOLN
300.0000 mg | Freq: Once | INTRAVENOUS | Status: AC
  Administered 2023-12-04: 300 mg via INTRAVENOUS
  Filled 2023-12-04: qty 300

## 2023-12-04 MED ORDER — SODIUM CHLORIDE 0.9 % IV SOLN: INTRAVENOUS | Status: DC

## 2023-12-04 NOTE — Progress Notes (Signed)
 Pt. arrived with pain to lt.anterior elbow area. Vein feels slightly hard to touch and mildly swollen. No redness noted. Pt. stated that it started this morning. Tender to touch. Sunnie England in to assess.  Unable to schedule U/S at [facility] [place] U/S able to accommodate patient today. Pt. on his way there.

## 2023-12-04 NOTE — Patient Instructions (Signed)
 [facility] - A DEPT OF [facility]  Discharge Instructions: Thank you for choosing [facility] to provide your oncology and hematology care.   If you have a lab appointment with the Cancer Center, please go directly to the Cancer Center and check in at the registration area.  Wear comfortable clothing and clothing appropriate for easy access to any Portacath or PICC line.   We strive to give you quality time with your provider. You may need to reschedule your appointment if you arrive late (15 or more minutes).  Arriving late affects you and other patients whose appointments are after yours.  Also, if you miss three or more appointments without notifying the office, you may be dismissed from the clinic at the provider's discretion.      For prescription refill requests, have your pharmacy contact our office and allow 72 hours for refills to be completed.    Today you received the following agent: Venofer       To help prevent nausea and vomiting after your treatment, we encourage you to take your nausea medication as directed.  BELOW ARE SYMPTOMS THAT SHOULD BE REPORTED IMMEDIATELY: *FEVER GREATER THAN 100.4 F (38 C) OR HIGHER *CHILLS OR SWEATING *NAUSEA AND VOMITING THAT IS NOT CONTROLLED WITH YOUR NAUSEA MEDICATION *UNUSUAL SHORTNESS OF BREATH *UNUSUAL BRUISING OR BLEEDING *URINARY PROBLEMS (pain or burning when urinating, or frequent urination) *BOWEL PROBLEMS (unusual diarrhea, constipation, pain near the anus) TENDERNESS IN MOUTH AND THROAT WITH OR WITHOUT PRESENCE OF ULCERS (sore throat, sores in mouth, or a toothache) UNUSUAL RASH, SWELLING OR PAIN  UNUSUAL VAGINAL DISCHARGE OR ITCHING   Items with * indicate a potential emergency and should be followed up as soon as possible or go to the Emergency Department if any problems should occur.  Please show the CHEMOTHERAPY ALERT CARD or IMMUNOTHERAPY ALERT CARD at check-in to the  Emergency Department and triage nurse. Should you have questions after your visit or need to cancel or reschedule your appointment, please contact [facility] - A DEPT OF [facility]  [phone] and follow the prompts.  Office hours are 8:00 a.m. to 4:30 p.m. Monday - Friday. Please note that voicemails left after 4:00 p.m. may not be returned until the following business day.  We are closed weekends and major holidays. You have access to a nurse at all times for urgent questions. Please call the main number to the clinic [phone] and follow the prompts.  For any non-urgent questions, you may also contact your provider using MyChart. We now offer e-Visits for anyone 61 and older to request care online for non-urgent symptoms. For details visit [email or web address].   Also download the MyChart app! Go to the app store, search "MyChart", open the app, select [facility], and log in with your MyChart username and password.

## 2023-12-05 ENCOUNTER — Other Ambulatory Visit: Payer: Self-pay | Admitting: Medical Oncology

## 2023-12-05 MED ORDER — APIXABAN (ELIQUIS) VTE STARTER PACK (10MG AND 5MG): ORAL_TABLET | ORAL | 0 refills | Status: DC

## 2023-12-05 NOTE — Progress Notes (Signed)
 I called and discussed patient ultrasound results with him showing a localized occlusive clot of his arm.  This does not appear to be a deep vein thrombus.  I have reviewed case with Dr. Maria Shiner.  At this time we are electing to start him on Eliquis  x 6 to 8 weeks.  I have sent this medication into the pharmacy for him and I discussed how to take this medication along with common potential side effects and precautions.  We discussed bleeding precautions especially given his history of GI bleed.  He knows to read signs to watch for and when to seek medical care.  He has blood work that will be completed down where he lives in about 1 month to make sure that his hemoglobin and CMP are within normal range.  In addition we discussed red flag signs and symptoms regarding blood clots and stroke.  He will keep his follow-up with me as scheduled.

## 2023-12-06 ENCOUNTER — Other Ambulatory Visit: Payer: Self-pay

## 2023-12-06 ENCOUNTER — Telehealth: Payer: Self-pay

## 2023-12-06 MED ORDER — RIVAROXABAN (XARELTO) VTE STARTER PACK (15 & 20 MG): ORAL_TABLET | ORAL | 0 refills | Status: DC

## 2023-12-06 NOTE — Telephone Encounter (Signed)
 Received phone call from patient stating that his co-pay for the Eliquis prescription was $330 and which he is unable to afford. Pt states Sunnie England, [place] told him to call if the prescription was too expensive. Sunnie England, PA aware and verbal order received for new prescription for Xarelto starter pack.  Pt aware and pharmacy confirmed. New prescription sent and pt educated that this office is unsure if this prescription will be cheaper than the previous one ordered. Pt verbalized understanding and had no further questions and stated he will keep the office up to date on prescription status.

## 2023-12-11 ENCOUNTER — Inpatient Hospital Stay

## 2023-12-12 ENCOUNTER — Inpatient Hospital Stay: Attending: Hematology & Oncology

## 2023-12-12 ENCOUNTER — Other Ambulatory Visit: Payer: Self-pay | Admitting: Medical Oncology

## 2023-12-12 VITALS — BP 108/62 | HR 62 | Temp 98.3°F | Resp 18

## 2023-12-12 DIAGNOSIS — D500 Iron deficiency anemia secondary to blood loss (chronic): Secondary | ICD-10-CM | POA: Insufficient documentation

## 2023-12-12 DIAGNOSIS — R7989 Other specified abnormal findings of blood chemistry: Secondary | ICD-10-CM | POA: Insufficient documentation

## 2023-12-12 DIAGNOSIS — Z79899 Other long term (current) drug therapy: Secondary | ICD-10-CM | POA: Diagnosis not present

## 2023-12-12 DIAGNOSIS — I8290 Acute embolism and thrombosis of unspecified vein: Secondary | ICD-10-CM

## 2023-12-12 DIAGNOSIS — K909 Intestinal malabsorption, unspecified: Secondary | ICD-10-CM

## 2023-12-12 DIAGNOSIS — K922 Gastrointestinal hemorrhage, unspecified: Secondary | ICD-10-CM | POA: Diagnosis not present

## 2023-12-12 MED ORDER — SODIUM CHLORIDE 0.9 % IV SOLN
300.0000 mg | Freq: Once | INTRAVENOUS | Status: AC
  Administered 2023-12-12: 300.0065 mg via INTRAVENOUS
  Filled 2023-12-12: qty 300

## 2023-12-12 MED ORDER — SODIUM CHLORIDE 0.9 % IV SOLN: INTRAVENOUS | Status: DC

## 2023-12-12 NOTE — Patient Instructions (Signed)

## 2023-12-30 ENCOUNTER — Other Ambulatory Visit: Payer: Self-pay | Admitting: *Deleted

## 2023-12-30 MED ORDER — APIXABAN 5 MG PO TABS: 5.0000 mg | ORAL_TABLET | Freq: Two times a day (BID) | ORAL | 4 refills | Status: DC

## 2024-04-30 ENCOUNTER — Inpatient Hospital Stay: Admitting: Medical Oncology

## 2024-04-30 ENCOUNTER — Encounter: Payer: Self-pay | Admitting: Medical Oncology

## 2024-04-30 ENCOUNTER — Inpatient Hospital Stay: Attending: Medical Oncology

## 2024-04-30 VITALS — BP 128/63 | HR 50 | Temp 98.1°F | Resp 18 | Ht 69.0 in | Wt 186.2 lb

## 2024-04-30 DIAGNOSIS — N189 Chronic kidney disease, unspecified: Secondary | ICD-10-CM | POA: Insufficient documentation

## 2024-04-30 DIAGNOSIS — D500 Iron deficiency anemia secondary to blood loss (chronic): Secondary | ICD-10-CM | POA: Diagnosis not present

## 2024-04-30 DIAGNOSIS — R634 Abnormal weight loss: Secondary | ICD-10-CM | POA: Diagnosis not present

## 2024-04-30 DIAGNOSIS — D696 Thrombocytopenia, unspecified: Secondary | ICD-10-CM | POA: Diagnosis not present

## 2024-04-30 DIAGNOSIS — K909 Intestinal malabsorption, unspecified: Secondary | ICD-10-CM

## 2024-04-30 DIAGNOSIS — I8290 Acute embolism and thrombosis of unspecified vein: Secondary | ICD-10-CM | POA: Insufficient documentation

## 2024-04-30 DIAGNOSIS — Z79899 Other long term (current) drug therapy: Secondary | ICD-10-CM | POA: Insufficient documentation

## 2024-04-30 DIAGNOSIS — K922 Gastrointestinal hemorrhage, unspecified: Secondary | ICD-10-CM | POA: Diagnosis not present

## 2024-04-30 DIAGNOSIS — D582 Other hemoglobinopathies: Secondary | ICD-10-CM

## 2024-04-30 DIAGNOSIS — K449 Diaphragmatic hernia without obstruction or gangrene: Secondary | ICD-10-CM | POA: Insufficient documentation

## 2024-04-30 DIAGNOSIS — R899 Unspecified abnormal finding in specimens from other organs, systems and tissues: Secondary | ICD-10-CM | POA: Diagnosis not present

## 2024-04-30 DIAGNOSIS — Z884 Allergy status to anesthetic agent status: Secondary | ICD-10-CM | POA: Diagnosis not present

## 2024-04-30 DIAGNOSIS — R7989 Other specified abnormal findings of blood chemistry: Secondary | ICD-10-CM

## 2024-04-30 LAB — CMP (CANCER CENTER ONLY)
ALT: 18 U/L (ref 0–44)
AST: 25 U/L (ref 15–41)
Albumin: 4.7 g/dL (ref 3.5–5.0)
Alkaline Phosphatase: 52 U/L (ref 38–126)
Anion gap: 15 (ref 5–15)
BUN: 23 mg/dL (ref 8–23)
CO2: 25 mmol/L (ref 22–32)
Calcium: 9.9 mg/dL (ref 8.9–10.3)
Chloride: 101 mmol/L (ref 98–111)
Creatinine: 1.35 mg/dL — ABNORMAL HIGH (ref 0.61–1.24)
GFR, Estimated: 55 mL/min — ABNORMAL LOW (ref >60)
Glucose, Bld: 88 mg/dL (ref 70–99)
Potassium: 3.6 mmol/L (ref 3.5–5.1)
Sodium: 141 mmol/L (ref 135–145)
Total Bilirubin: 1 mg/dL (ref 0.0–1.2)
Total Protein: 7.2 g/dL (ref 6.5–8.1)

## 2024-04-30 LAB — RETIC PANEL
Immature Retic Fract: 8 % (ref 2.3–15.9)
RBC.: 5.34 MIL/uL (ref 4.22–5.81)
Retic Count, Absolute: 66.2 K/uL (ref 19.0–186.0)
Retic Ct Pct: 1.2 % (ref 0.4–3.1)
Reticulocyte Hemoglobin: 35 pg (ref >27.9)

## 2024-04-30 LAB — CBC
HCT: 50.3 % (ref 39.0–52.0)
Hemoglobin: 17.1 g/dL — ABNORMAL HIGH (ref 13.0–17.0)
MCH: 31.6 pg (ref 26.0–34.0)
MCHC: 34 g/dL (ref 30.0–36.0)
MCV: 93 fL (ref 80.0–100.0)
Platelets: 149 K/uL — ABNORMAL LOW (ref 150–400)
RBC: 5.41 MIL/uL (ref 4.22–5.81)
RDW: 13.8 % (ref 11.5–15.5)
WBC: 5.8 K/uL (ref 4.0–10.5)
nRBC: 0 % (ref 0.0–0.2)

## 2024-04-30 LAB — IRON AND IRON BINDING CAPACITY (CC-WL,HP ONLY)
Iron: 107 ug/dL (ref 45–182)
Saturation Ratios: 25 % (ref 17.9–39.5)
TIBC: 426 ug/dL (ref 250–450)
UIBC: 319 ug/dL

## 2024-04-30 LAB — ANTITHROMBIN III: AntiThromb III Func: 105 % (ref 75–120)

## 2024-04-30 LAB — FERRITIN: Ferritin: 205 ng/mL (ref 24–336)

## 2024-04-30 NOTE — Progress Notes (Signed)
 Hematology and Oncology Follow Up Visit  Nathaniel Grimes [id number] 26-Mar-1951 73 y.o. 04/30/2024   Principle Diagnosis:  Iron  deficiency anemia-GI blood loss  Current Therapy:   Oral iron /vitamin C- on hold due to improved counts     Interim History:  Nathaniel Grimes is back for follow-up.    He states that he has been well.He has lost weight on purpose with increased activity and diet modifications to help with a hiatal hernia. 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 There ceremony is in January. He coached both club and professional.   Health wise he has been well. He is off of his Eliquis .   There has been no bleeding to his knowledge: denies epistaxis, gingivitis, hemoptysis, hematemesis, hematuria, melena, excessive bruising, blood donation.   He has had no fever.  He has had no cough or shortness of breath.  He has had no obvious melena or bright red blood per rectum.  Overall, I would say his performance status is probably ECOG 0. Wt Readings from Last 3 Encounters:  04/30/24 186 lb 3.2 oz (84.5 kg)  10/31/23 203 lb 1.3 oz (92.1 kg)  05/02/23 197 lb 1.3 oz (89.4 kg)   Medications:  Current Outpatient Medications:    Acetaminophen (TYLENOL 8 HOUR PO), Take 1 tablet by mouth every 6 (six) hours as needed., Disp: , Rfl:    allopurinol (ZYLOPRIM) 300 MG tablet, Take 150 mg by mouth daily., Disp: , Rfl:    ALPRAZolam (XANAX) 0.5 MG tablet, Take 0.5 mg by mouth daily as needed., Disp: , Rfl:    lisinopril (ZESTRIL) 10 MG tablet, Take 10 mg by mouth every morning., Disp: , Rfl:    ondansetron  (ZOFRAN ) 4 MG tablet, Take 1 tablet (4 mg total) by mouth every 8 (eight) hours as needed for nausea or vomiting (every 6 hours as needed). May take very 6-8 hours as needed, Disp: 10 tablet, Rfl: 0   pantoprazole (PROTONIX) 40 MG tablet, Take 40 mg by mouth 2 (two) times daily., Disp: , Rfl:    potassium chloride  (KLOR-CON) 10 MEQ tablet, Take 10 mEq by mouth 2 (two) times daily., Disp: , Rfl:    rosuvastatin (CRESTOR) 40 MG tablet, Take 40 mg by mouth daily., Disp: , Rfl:    scopolamine (TRANSDERM-SCOP) 1 MG/3DAYS, Place 1 patch onto the skin every 3 (three) days. (Patient taking differently: Place 1 patch onto the skin every 3 (three) days.), Disp: , Rfl:    tadalafil (CIALIS) 5 MG tablet, Take 5 mg by mouth daily., Disp: , Rfl:    triamterene-hydrochlorothiazide (MAXZIDE-25) 37.5-25 MG tablet, Take 1 tablet by mouth daily., Disp: , Rfl:   Allergies:  Allergies  Allergen Reactions   Other Nausea And Vomiting    Anesthesia used for a brow lift. Severe nausea and vomiting in the recovery room.    Past Medical History, Surgical history, Social history, and Family History were reviewed and updated.  Review of Systems: Review of Systems  Constitutional: Negative.   HENT:  Negative.    Eyes: Negative.   Respiratory: Negative.    Cardiovascular: Negative.   Gastrointestinal: Negative.   Endocrine: Negative.   Genitourinary: Negative.    Musculoskeletal: Negative.   Skin: Negative.   Neurological: Negative.   Hematological: Negative.   Psychiatric/Behavioral: Negative.      Physical Exam:  height is 5' 9 (1.753 m) and weight is 186 lb 3.2 oz (  84.5 kg). His oral temperature is 98.1 F (36.7 C). His blood pressure is 128/63 and his pulse is 50 (abnormal). His respiration is 18 and oxygen saturation is 100%.   Wt Readings from Last 3 Encounters:  04/30/24 186 lb 3.2 oz (84.5 kg)  10/31/23 203 lb 1.3 oz (92.1 kg)  05/02/23 197 lb 1.3 oz (89.4 kg)    Physical Exam Vitals reviewed.  HENT:     Head: Normocephalic and atraumatic.  Eyes:     Pupils: Pupils are equal, round, and reactive to light.  Cardiovascular:     Rate and Rhythm: Normal rate and regular rhythm.     Heart sounds: Normal heart sounds.  Pulmonary:     Effort: Pulmonary effort is normal.     Breath sounds: Normal breath  sounds.  Abdominal:     General: Bowel sounds are normal.     Palpations: Abdomen is soft.  Musculoskeletal:        General: No tenderness or deformity. Normal range of motion.     Cervical back: Normal range of motion.  Lymphadenopathy:     Cervical: No cervical adenopathy.  Skin:    General: Skin is warm and dry.     Findings: No erythema or rash.  Neurological:     Mental Status: He is alert and oriented to person, place, and time.  Psychiatric:        Behavior: Behavior normal.        Thought Content: Thought content normal.        Judgment: Judgment normal.      Lab Results  Component Value Date   WBC 5.8 04/30/2024   HGB 17.1 (H) 04/30/2024   HCT 50.3 04/30/2024   MCV 93.0 04/30/2024   PLT 149 (L) 04/30/2024     Chemistry      Component Value Date/Time   NA 141 04/30/2024 1052   K 3.6 04/30/2024 1052   CL 101 04/30/2024 1052   CO2 25 04/30/2024 1052   BUN 23 04/30/2024 1052   CREATININE 1.35 (H) 04/30/2024 1052      Component Value Date/Time   CALCIUM 9.9 04/30/2024 1052   ALKPHOS 52 04/30/2024 1052   AST 25 04/30/2024 1052   ALT 18 04/30/2024 1052   BILITOT 1.0 04/30/2024 1052     Encounter Diagnoses  Name Primary?   Iron  deficiency anemia due to chronic blood loss Yes   Thrombus    Chronic kidney disease, unspecified CKD stage     Impression and Plan: Nathaniel Grimes is a very nice 73 year old white male.  S/p IV Iron  treatments for a suspected GI bleed that has appeared to have resolved. He also had a history of peripheral superficial clot of his arm following IV placement. He is having hypercoagable work up today to see if he needs to return to using his Eliquis .   Hgb 17.1 today. Platelets 149,000 Iron  studies pending We will watch his Hgb and get a sleep study/ JAK2 testing is needed  Hypercoagable studies pending- if possible will discuss restarting his Eliquis .   Disposition RTC 6 months APP, labs -[place]   Lauraine CHRISTELLA Dais,  PA-C 10/23/202512:25 PM

## 2024-05-01 LAB — PROTEIN C ACTIVITY: Protein C Activity: 98 % (ref 73–180)

## 2024-05-01 LAB — ERYTHROPOIETIN: Erythropoietin: 12.9 m[IU]/mL (ref 2.6–18.5)

## 2024-05-01 LAB — PROTEIN S ACTIVITY: Protein S Activity: 127 % (ref 63–140)

## 2024-05-01 LAB — PROTEIN S, TOTAL: Protein S Ag, Total: 81 % (ref 60–150)

## 2024-05-01 LAB — LUPUS ANTICOAGULANT PANEL
DRVVT: 34.4 s (ref 0.0–47.0)
PTT Lupus Anticoagulant: 33.6 s (ref 0.0–43.5)

## 2024-05-02 LAB — HOMOCYSTEINE: Homocysteine: 20.1 umol/L — ABNORMAL HIGH (ref 0.0–19.2)

## 2024-05-02 LAB — PROTEIN C, TOTAL: Protein C, Total: 84 % (ref 60–150)

## 2024-05-03 LAB — BETA-2-GLYCOPROTEIN I ABS, IGG/M/A
Beta-2 Glyco I IgG: <9 GPI IgG units (ref 0–20)
Beta-2-Glycoprotein I IgA: <9 GPI IgA units (ref 0–25)
Beta-2-Glycoprotein I IgM: 45 GPI IgM units — ABNORMAL HIGH (ref 0–32)

## 2024-05-04 LAB — CARDIOLIPIN ANTIBODIES, IGG, IGM, IGA
Anticardiolipin IgA: <9 U/mL (ref 0–11)
Anticardiolipin IgG: <9 GPL U/mL (ref 0–14)
Anticardiolipin IgM: <9 [MPL'U]/mL (ref 0–12)

## 2024-05-04 LAB — PROTHROMBIN GENE MUTATION

## 2024-05-05 ENCOUNTER — Ambulatory Visit: Payer: Self-pay | Admitting: Medical Oncology

## 2024-05-05 ENCOUNTER — Other Ambulatory Visit: Payer: Self-pay | Admitting: Medical Oncology

## 2024-05-05 DIAGNOSIS — R899 Unspecified abnormal finding in specimens from other organs, systems and tissues: Secondary | ICD-10-CM

## 2024-05-06 LAB — FACTOR 5 LEIDEN

## 2024-07-30 ENCOUNTER — Encounter: Payer: Self-pay | Admitting: Internal Medicine

## 2024-10-29 ENCOUNTER — Inpatient Hospital Stay

## 2024-10-29 ENCOUNTER — Inpatient Hospital Stay: Admitting: Medical Oncology
